# Patient Record
Sex: Female | Born: 1940 | Race: White | Hispanic: No | Smoking: Former smoker
Health system: Southern US, Community
[De-identification: ages and names within clinical notes are randomized; demographics above are authoritative.]

## PROBLEM LIST (undated history)

## (undated) DIAGNOSIS — E785 Hyperlipidemia, unspecified: Secondary | ICD-10-CM

## (undated) DIAGNOSIS — B019 Varicella without complication: Secondary | ICD-10-CM

## (undated) DIAGNOSIS — E1129 Type 2 diabetes mellitus with other diabetic kidney complication: Secondary | ICD-10-CM

## (undated) DIAGNOSIS — D721 Eosinophilia, unspecified: Secondary | ICD-10-CM

## (undated) DIAGNOSIS — Z87442 Personal history of urinary calculi: Secondary | ICD-10-CM

## (undated) DIAGNOSIS — E039 Hypothyroidism, unspecified: Secondary | ICD-10-CM

## (undated) DIAGNOSIS — M1990 Unspecified osteoarthritis, unspecified site: Secondary | ICD-10-CM

## (undated) DIAGNOSIS — I10 Essential (primary) hypertension: Secondary | ICD-10-CM

## (undated) DIAGNOSIS — M810 Age-related osteoporosis without current pathological fracture: Secondary | ICD-10-CM

## (undated) DIAGNOSIS — E119 Type 2 diabetes mellitus without complications: Secondary | ICD-10-CM

## (undated) DIAGNOSIS — K5792 Diverticulitis of intestine, part unspecified, without perforation or abscess without bleeding: Secondary | ICD-10-CM

## (undated) DIAGNOSIS — G4733 Obstructive sleep apnea (adult) (pediatric): Secondary | ICD-10-CM

## (undated) DIAGNOSIS — K219 Gastro-esophageal reflux disease without esophagitis: Secondary | ICD-10-CM

## (undated) HISTORY — PX: CATARACT EXTRACTION: SUR2

## (undated) HISTORY — DX: Age-related osteoporosis without current pathological fracture: M81.0

## (undated) HISTORY — DX: Hypothyroidism, unspecified: E03.9

## (undated) HISTORY — DX: Unspecified osteoarthritis, unspecified site: M19.90

## (undated) HISTORY — DX: Eosinophilia, unspecified: D72.10

## (undated) HISTORY — DX: Hyperlipidemia, unspecified: E78.5

## (undated) HISTORY — DX: Eosinophilia: D72.1

## (undated) HISTORY — PX: LASIK: SHX215

## (undated) HISTORY — DX: Essential (primary) hypertension: I10

## (undated) HISTORY — DX: Obstructive sleep apnea (adult) (pediatric): G47.33

## (undated) HISTORY — DX: Type 2 diabetes mellitus with other diabetic kidney complication: E11.29

## (undated) HISTORY — DX: Varicella without complication: B01.9

---

## 1970-11-25 HISTORY — PX: OTHER SURGICAL HISTORY: SHX169

## 1999-02-27 ENCOUNTER — Ambulatory Visit (HOSPITAL_COMMUNITY): Admission: RE | Admit: 1999-02-27 | Discharge: 1999-02-27 | Payer: Self-pay | Admitting: *Deleted

## 1999-03-09 ENCOUNTER — Ambulatory Visit (HOSPITAL_COMMUNITY): Admission: RE | Admit: 1999-03-09 | Discharge: 1999-03-09 | Payer: Self-pay | Admitting: *Deleted

## 1999-03-12 ENCOUNTER — Other Ambulatory Visit: Admission: RE | Admit: 1999-03-12 | Discharge: 1999-03-12 | Payer: Self-pay | Admitting: *Deleted

## 2000-03-17 ENCOUNTER — Other Ambulatory Visit: Admission: RE | Admit: 2000-03-17 | Discharge: 2000-03-17 | Payer: Self-pay | Admitting: Internal Medicine

## 2000-04-04 ENCOUNTER — Ambulatory Visit (HOSPITAL_COMMUNITY): Admission: RE | Admit: 2000-04-04 | Discharge: 2000-04-04 | Payer: Self-pay | Admitting: *Deleted

## 2001-03-10 ENCOUNTER — Other Ambulatory Visit: Admission: RE | Admit: 2001-03-10 | Discharge: 2001-03-10 | Payer: Self-pay | Admitting: *Deleted

## 2001-04-06 ENCOUNTER — Ambulatory Visit (HOSPITAL_COMMUNITY): Admission: RE | Admit: 2001-04-06 | Discharge: 2001-04-06 | Payer: Self-pay | Admitting: *Deleted

## 2002-07-12 ENCOUNTER — Emergency Department (HOSPITAL_COMMUNITY): Admission: EM | Admit: 2002-07-12 | Discharge: 2002-07-12 | Payer: Self-pay | Admitting: Emergency Medicine

## 2002-08-20 ENCOUNTER — Encounter: Payer: Self-pay | Admitting: Family Medicine

## 2002-08-20 ENCOUNTER — Ambulatory Visit (HOSPITAL_COMMUNITY): Admission: RE | Admit: 2002-08-20 | Discharge: 2002-08-20 | Payer: Self-pay | Admitting: Family Medicine

## 2003-12-27 ENCOUNTER — Ambulatory Visit (HOSPITAL_COMMUNITY): Admission: RE | Admit: 2003-12-27 | Discharge: 2003-12-27 | Payer: Self-pay | Admitting: Family Medicine

## 2006-02-03 ENCOUNTER — Ambulatory Visit (HOSPITAL_COMMUNITY): Admission: RE | Admit: 2006-02-03 | Discharge: 2006-02-03 | Payer: Self-pay | Admitting: Family Medicine

## 2006-07-01 ENCOUNTER — Ambulatory Visit: Payer: Self-pay | Admitting: Gastroenterology

## 2007-02-20 ENCOUNTER — Ambulatory Visit (HOSPITAL_COMMUNITY): Admission: RE | Admit: 2007-02-20 | Discharge: 2007-02-20 | Payer: Self-pay | Admitting: *Deleted

## 2008-06-01 ENCOUNTER — Ambulatory Visit (HOSPITAL_COMMUNITY): Admission: RE | Admit: 2008-06-01 | Discharge: 2008-06-01 | Payer: Self-pay | Admitting: *Deleted

## 2009-06-20 ENCOUNTER — Ambulatory Visit (HOSPITAL_COMMUNITY): Admission: RE | Admit: 2009-06-20 | Discharge: 2009-06-20 | Payer: Self-pay | Admitting: *Deleted

## 2010-08-14 ENCOUNTER — Ambulatory Visit (HOSPITAL_COMMUNITY): Admission: RE | Admit: 2010-08-14 | Discharge: 2010-08-14 | Payer: Self-pay | Admitting: Internal Medicine

## 2010-11-30 ENCOUNTER — Ambulatory Visit: Payer: Self-pay | Admitting: Oncology

## 2010-12-06 LAB — CBC WITH DIFFERENTIAL/PLATELET
BASO%: 0.7 % (ref 0.0–2.0)
Basophils Absolute: 0 10*3/uL (ref 0.0–0.1)
EOS%: 9.9 % — ABNORMAL HIGH (ref 0.0–7.0)
Eosinophils Absolute: 0.7 10*3/uL — ABNORMAL HIGH (ref 0.0–0.5)
HCT: 36.1 % (ref 34.8–46.6)
HGB: 12.1 g/dL (ref 11.6–15.9)
LYMPH%: 20.3 % (ref 14.0–49.7)
MCH: 30.4 pg (ref 25.1–34.0)
MCHC: 33.5 g/dL (ref 31.5–36.0)
MCV: 90.7 fL (ref 79.5–101.0)
MONO#: 0.7 10*3/uL (ref 0.1–0.9)
MONO%: 10.2 % (ref 0.0–14.0)
NEUT#: 4 10*3/uL (ref 1.5–6.5)
NEUT%: 58.9 % (ref 38.4–76.8)
Platelets: 281 10*3/uL (ref 145–400)
RBC: 3.98 10*6/uL (ref 3.70–5.45)
RDW: 13.1 % (ref 11.2–14.5)
WBC: 6.7 10*3/uL (ref 3.9–10.3)
lymph#: 1.4 10*3/uL (ref 0.9–3.3)

## 2010-12-06 LAB — COMPREHENSIVE METABOLIC PANEL
ALT: 10 U/L (ref 0–35)
AST: 14 U/L (ref 0–37)
Albumin: 4.2 g/dL (ref 3.5–5.2)
Alkaline Phosphatase: 82 U/L (ref 39–117)
BUN: 23 mg/dL (ref 6–23)
CO2: 27 mEq/L (ref 19–32)
Calcium: 10.1 mg/dL (ref 8.4–10.5)
Chloride: 103 mEq/L (ref 96–112)
Creatinine, Ser: 0.88 mg/dL (ref 0.40–1.20)
Glucose, Bld: 85 mg/dL (ref 70–99)
Potassium: 4.2 mEq/L (ref 3.5–5.3)
Sodium: 140 mEq/L (ref 135–145)
Total Bilirubin: 0.3 mg/dL (ref 0.3–1.2)
Total Protein: 6.9 g/dL (ref 6.0–8.3)

## 2010-12-06 LAB — CHCC SMEAR

## 2011-04-05 ENCOUNTER — Encounter (HOSPITAL_BASED_OUTPATIENT_CLINIC_OR_DEPARTMENT_OTHER): Payer: Medicare Other | Admitting: Oncology

## 2011-04-05 ENCOUNTER — Other Ambulatory Visit: Payer: Self-pay | Admitting: Oncology

## 2011-04-05 DIAGNOSIS — D721 Eosinophilia, unspecified: Secondary | ICD-10-CM

## 2011-04-05 LAB — CBC WITH DIFFERENTIAL/PLATELET
Eosinophils Absolute: 0.8 10*3/uL — ABNORMAL HIGH (ref 0.0–0.5)
HGB: 12.6 g/dL (ref 11.6–15.9)
LYMPH%: 20.9 % (ref 14.0–49.7)
MCH: 30.8 pg (ref 25.1–34.0)
MONO#: 0.7 10*3/uL (ref 0.1–0.9)
MONO%: 11.1 % (ref 0.0–14.0)
NEUT%: 54.3 % (ref 38.4–76.8)

## 2011-09-18 ENCOUNTER — Other Ambulatory Visit (HOSPITAL_COMMUNITY): Payer: Self-pay | Admitting: Internal Medicine

## 2011-09-18 DIAGNOSIS — Z1231 Encounter for screening mammogram for malignant neoplasm of breast: Secondary | ICD-10-CM

## 2011-10-10 ENCOUNTER — Ambulatory Visit (HOSPITAL_COMMUNITY)
Admission: RE | Admit: 2011-10-10 | Discharge: 2011-10-10 | Disposition: A | Discharge status: Home / Self Care | Payer: Medicare Other | Source: Ambulatory Visit | Attending: Internal Medicine | Admitting: Internal Medicine

## 2011-10-10 DIAGNOSIS — Z1231 Encounter for screening mammogram for malignant neoplasm of breast: Secondary | ICD-10-CM

## 2012-01-16 ENCOUNTER — Other Ambulatory Visit: Payer: Self-pay | Admitting: Nurse Practitioner

## 2012-01-16 ENCOUNTER — Other Ambulatory Visit (HOSPITAL_COMMUNITY)
Admission: RE | Admit: 2012-01-16 | Discharge: 2012-01-16 | Disposition: A | Discharge status: Home / Self Care | Payer: Medicare Other | Source: Ambulatory Visit | Attending: Obstetrics and Gynecology | Admitting: Obstetrics and Gynecology

## 2012-01-16 DIAGNOSIS — Z1159 Encounter for screening for other viral diseases: Secondary | ICD-10-CM | POA: Insufficient documentation

## 2012-01-16 DIAGNOSIS — Z124 Encounter for screening for malignant neoplasm of cervix: Secondary | ICD-10-CM | POA: Insufficient documentation

## 2012-01-16 DIAGNOSIS — N760 Acute vaginitis: Secondary | ICD-10-CM | POA: Insufficient documentation

## 2012-01-28 ENCOUNTER — Other Ambulatory Visit: Payer: Self-pay | Admitting: Nurse Practitioner

## 2013-08-19 ENCOUNTER — Other Ambulatory Visit (HOSPITAL_COMMUNITY): Payer: Self-pay | Admitting: Internal Medicine

## 2013-08-19 DIAGNOSIS — Z1231 Encounter for screening mammogram for malignant neoplasm of breast: Secondary | ICD-10-CM

## 2013-08-24 ENCOUNTER — Ambulatory Visit (HOSPITAL_COMMUNITY)
Admission: RE | Admit: 2013-08-24 | Discharge: 2013-08-24 | Disposition: A | Discharge status: Home / Self Care | Payer: Medicare Other | Source: Ambulatory Visit | Attending: Internal Medicine | Admitting: Internal Medicine

## 2013-08-24 DIAGNOSIS — Z1231 Encounter for screening mammogram for malignant neoplasm of breast: Secondary | ICD-10-CM | POA: Insufficient documentation

## 2013-09-20 ENCOUNTER — Encounter: Payer: Self-pay | Admitting: Interventional Cardiology

## 2013-09-21 ENCOUNTER — Encounter: Payer: Self-pay | Admitting: Interventional Cardiology

## 2013-09-21 ENCOUNTER — Ambulatory Visit (INDEPENDENT_AMBULATORY_CARE_PROVIDER_SITE_OTHER): Payer: Medicare Other | Admitting: Interventional Cardiology

## 2013-09-21 VITALS — BP 98/64 | HR 73 | Ht 62.0 in | Wt 153.0 lb

## 2013-09-21 DIAGNOSIS — Z0389 Encounter for observation for other suspected diseases and conditions ruled out: Secondary | ICD-10-CM

## 2013-09-21 DIAGNOSIS — I10 Essential (primary) hypertension: Secondary | ICD-10-CM

## 2013-09-21 DIAGNOSIS — E782 Mixed hyperlipidemia: Secondary | ICD-10-CM

## 2013-09-21 NOTE — Progress Notes (Signed)
Patient ID: Renee Stout, female   DOB: [birth year], 72 y.o.   MRN: [id number]    [address] [place], [place]  [address] Phone: [phone] Fax:  [phone]  Date:  09/21/2013   ID:  Renee Stout, DOB 1941-07-02, MRN [id number]  PCP:  Darnelle Bos, MD      History of Present Illness: Renee Stout is a 72 y.o. female  who has had intermittent chest pain in 8/13-10/13. THe pain is across her chest and radiates to the jaw. Episodes always occurred at night and last from 1-3 hours. Some relief with baking soda and water. Sx not related to exertion. She walks on a treadmill 5 days a week for 45 minutes. No problems with this. No sweating with the episodes. She had a negative Cardiolite in 2013. She exercised for 6 minutes and there were no symptoms. Perfusion images were normal. Left ventricular ejection fraction was 77%.    No further sx since that time.  She walks about 5 days a week, 20 minutes a day.  She was taken off of DM meds due to improved sugars.         Wt Readings from Last 3 Encounters:  09/21/13 153 lb (69.4 kg)     Past Medical History  Diagnosis Date  . Unspecified hypothyroidism   . Type II or unspecified type diabetes mellitus with renal manifestations, not stated as uncontrolled(250.40)   . Hyperlipidemia   . Eosinophilia   . Obstructive sleep apnea (adult) (pediatric)   . Essential hypertension, benign   . Osteoporosis, unspecified     Current Outpatient Prescriptions  Medication Sig Dispense Refill  . Calcium Carb-Cholecalciferol (CALCIUM 600 + D PO) Take by mouth 2 (two) times daily.      Marland Kitchen lisinopril (PRINIVIL,ZESTRIL) 10 MG tablet Take one tablet daily      . Multiple Vitamins-Minerals (MULTIVITAL) tablet Take 1 tablet by mouth daily.      . simvastatin (ZOCOR) 40 MG tablet Take one tablet daily      . SYNTHROID 100 MCG tablet Take one tablet daily       No current facility-administered medications for this visit.     Allergies:    Allergies  Allergen Reactions  . Sulfa Antibiotics   . Sulfur     Causes hives    Social History:  The patient  reports that she quit smoking about 24 years ago. Her smoking use included Cigarettes. She smoked 0.00 packs per day for 10 years. She does not have any smokeless tobacco history on file. She reports that she drinks alcohol.   Family History:  The patient's family history includes Coronary artery disease in her other; Diabetes in her brother and sister.   ROS:  Please see the history of present illness.  No nausea, vomiting.  No fevers, chills.  No focal weakness.  No dysuria.    All other systems reviewed and negative.   PHYSICAL EXAM: VS:  BP 98/64  Pulse 73  Ht 5\' 2"  (1.575 m)  Wt 153 lb (69.4 kg)  BMI 27.98 kg/m2  SpO2 98% Well nourished, well developed, in no acute distress HEENT: normal Neck: no JVD, no carotid bruits Cardiac:  normal S1, S2; RRR;  Lungs:  clear to auscultation bilaterally, no wheezing, rhonchi or rales Abd: soft, nontender, no hepatomegaly Ext: no edema Skin: warm and dry Neuro:   no focal abnormalities noted  EKG:  Normal     ASSESSMENT AND  PLAN:   Hyperlipidemia LDL goal < 100  Continue Simvastatin Tablet, 20 MG, 1/2 tablet, Orally, Once a day .  In 9/14: TG 155, HDL 55, LDL 86. 3. 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 This was a false positive. Nuclear study showed normal wall motion.  Today's ECG was normal.  No further CP.  F/u prn.  Recommended 150 minutes of exercise per week. She should try to 5 days a week 30 minutes a day to keep her blood sugars down.  Signed, Fredric Mare, MD, [facility] 09/21/2013 9:51 AM

## 2013-09-21 NOTE — Patient Instructions (Signed)
Your physician recommends that you continue on your current medications as directed. Please refer to the Current Medication list given to you today.   Your physician recommends that you schedule a follow-up appointment as needed  

## 2014-03-23 ENCOUNTER — Encounter: Payer: Self-pay | Admitting: Interventional Cardiology

## 2014-03-25 ENCOUNTER — Encounter: Payer: Self-pay | Admitting: Interventional Cardiology

## 2014-04-06 ENCOUNTER — Encounter: Payer: Self-pay | Admitting: Internal Medicine

## 2014-04-06 ENCOUNTER — Ambulatory Visit (INDEPENDENT_AMBULATORY_CARE_PROVIDER_SITE_OTHER): Payer: Medicare HMO | Admitting: Internal Medicine

## 2014-04-06 VITALS — BP 102/62 | HR 79 | Temp 98.1°F | Ht 63.0 in | Wt 150.0 lb

## 2014-04-06 DIAGNOSIS — E119 Type 2 diabetes mellitus without complications: Secondary | ICD-10-CM

## 2014-04-06 DIAGNOSIS — K219 Gastro-esophageal reflux disease without esophagitis: Secondary | ICD-10-CM | POA: Insufficient documentation

## 2014-04-06 DIAGNOSIS — E782 Mixed hyperlipidemia: Secondary | ICD-10-CM

## 2014-04-06 DIAGNOSIS — I10 Essential (primary) hypertension: Secondary | ICD-10-CM

## 2014-04-06 NOTE — Progress Notes (Signed)
Pre visit review using our clinic review tool, if applicable. No additional management support is needed unless otherwise documented below in the visit note. 

## 2014-04-06 NOTE — Progress Notes (Signed)
HPI  Pt presents to the clinic today to establish care. She is transferring care from [facility]. She has no concerns today.  Past Medical History  Diagnosis Date  . Unspecified hypothyroidism   . Type II or unspecified type diabetes mellitus with renal manifestations, not stated as uncontrolled   . Hyperlipidemia   . Eosinophilia   . Obstructive sleep apnea (adult) (pediatric)   . Essential hypertension, benign   . Osteoporosis, unspecified   . Arthritis   . Chicken pox     Current Outpatient Prescriptions  Medication Sig Dispense Refill  . aspirin 81 MG tablet Take 81 mg by mouth daily.      . Calcium Carb-Cholecalciferol (CALCIUM 600 + D PO) Take by mouth 2 (two) times daily.      Marland Kitchen esomeprazole (NEXIUM) 20 MG capsule Take 20 mg by mouth daily at 12 noon.      Marland Kitchen lisinopril (PRINIVIL,ZESTRIL) 10 MG tablet Take one tablet daily      . Multiple Vitamins-Minerals (MULTIVITAL) tablet Take 1 tablet by mouth daily.      . simvastatin (ZOCOR) 40 MG tablet Take one tablet daily      . SYNTHROID 100 MCG tablet Take one tablet daily       No current facility-administered medications for this visit.    Allergies  Allergen Reactions  . Sulfa Antibiotics   . Sulfur     Causes hives    Family History  Problem Relation Age of Onset  . Coronary artery disease Other     positive for early CAD  . Diabetes Brother   . Diabetes Sister     History   Social History  . Marital Status: Married    Spouse Name: N/A    Number of Children: N/A  . Years of Education: N/A   Occupational History  . Not on file.   Social History Main Topics  . Smoking status: Former Smoker -- 10 years    Types: Cigarettes    Quit date: 11/25/1988  . Smokeless tobacco: Not on file  . Alcohol Use: Yes     Comment: rare   . Drug Use: No  . Sexual Activity: Not on file   Other Topics Concern  . Not on file   Social History Narrative   No caffeine   No Recreational drug use    Diet: regular    Exercise : yes - 30 min a day on treadmill 5x week    Occupation : retired Audiological scientist status : married ,Husband also our patient   Children: girls 1          ROS:  Constitutional: Denies fever, malaise, fatigue, headache or abrupt weight changes.  Respiratory: Denies difficulty breathing, shortness of breath, cough or sputum production.   Cardiovascular: Denies chest pain, chest tightness, palpitations or swelling in the hands or feet.  Skin: Denies redness, rashes, lesions or ulcercations.  Neurological: Denies dizziness, difficulty with memory, difficulty with speech or problems with balance and coordination.   No other specific complaints in a complete review of systems (except as listed in HPI above).  PE:  BP 102/62  Pulse 79  Temp(Src) 98.1 F (36.7 C) (Oral)  Ht 5\' 3"  (1.6 m)  Wt 150 lb (68.04 kg)  BMI 26.58 kg/m2  SpO2 98% Wt Readings from Last 3 Encounters:  04/06/14 150 lb (68.04 kg)  09/21/13 153 lb (69.4 kg)    General: Appears her stated age, well developed, well  nourished in NAD. Cardiovascular: Normal rate and rhythm. S1,S2 noted.  No murmur, rubs or gallops noted. No JVD or BLE edema. No carotid bruits noted. Pulmonary/Chest: Normal effort and positive vesicular breath sounds. No respiratory distress. No wheezes, rales or ronchi noted.  Neurological: Alert and oriented. Cranial nerves II-XII intact. Coordination normal. +DTRs bilaterally.   BMET    Component Value Date/Time   NA 140 12/06/2010 1035   K 4.2 12/06/2010 1035   CL 103 12/06/2010 1035   CO2 27 12/06/2010 1035   GLUCOSE 85 12/06/2010 1035   BUN 23 12/06/2010 1035   CREATININE 0.88 12/06/2010 1035   CALCIUM 10.1 12/06/2010 1035    Lipid Panel  No results found for this basename: chol, trig, hdl, cholhdl, vldl, ldlcalc    CBC    Component Value Date/Time   WBC 6.1 04/05/2011 1310   RBC 4.10 04/05/2011 1310   HGB 12.6 04/05/2011 1310   HCT 37.4 04/05/2011 1310   PLT 267  04/05/2011 1310   MCV 91.2 04/05/2011 1310   MCH 30.8 04/05/2011 1310   MCH 30.4 12/06/2010 1035   MCHC 33.8 04/05/2011 1310   RDW 13.2 04/05/2011 1310   LYMPHSABS 1.3 04/05/2011 1310   MONOABS 0.7 04/05/2011 1310   EOSABS 0.8* 04/05/2011 1310   BASOSABS 0.0 04/05/2011 1310    Hgb A1C No results found for this basename: HGBA1C     Assessment and Plan:

## 2014-04-06 NOTE — Assessment & Plan Note (Signed)
Well controlled on current therapy Will obtain labs from recent PCP

## 2014-04-06 NOTE — Patient Instructions (Addendum)
Type 2 Diabetes Mellitus, Adult Type 2 diabetes mellitus, often simply referred to as type 2 diabetes, is a long-lasting (chronic) disease. In type 2 diabetes, the pancreas does not make enough insulin (a hormone), the cells are less responsive to the insulin that is made (insulin resistance), or both. Normally, insulin moves sugars from food into the tissue cells. The tissue cells use the sugars for energy. The lack of insulin or the lack of normal response to insulin causes excess sugars to build up in the blood instead of going into the tissue cells. As a result, high blood sugar (hyperglycemia) develops. The effect of high sugar (glucose) levels can cause many complications. Type 2 diabetes was also previously called adult-onset diabetes but it can occur at any age.  RISK FACTORS  A person is predisposed to developing type 2 diabetes if someone in the family has the disease and also has one or more of the following primary risk factors:  Overweight.  An inactive lifestyle.  A history of consistently eating high-calorie foods. Maintaining a normal weight and regular physical activity can reduce the chance of developing type 2 diabetes. SYMPTOMS  A person with type 2 diabetes may not show symptoms initially. The symptoms of type 2 diabetes appear slowly. The symptoms include:  Increased thirst (polydipsia).  Increased urination (polyuria).  Increased urination during the night (nocturia).  Weight loss. This weight loss may be rapid.  Frequent, recurring infections.  Tiredness (fatigue).  Weakness.  Vision changes, such as blurred vision.  Fruity smell to your breath.  Abdominal pain.  Nausea or vomiting.  Cuts or bruises which are slow to heal.  Tingling or numbness in the hands or feet. DIAGNOSIS Type 2 diabetes is frequently not diagnosed until complications of diabetes are present. Type 2 diabetes is diagnosed when symptoms or complications are present and when blood  glucose levels are increased. Your blood glucose level may be checked by one or more of the following blood tests:  A fasting blood glucose test. You will not be allowed to eat for at least 8 hours before a blood sample is taken.  A random blood glucose test. Your blood glucose is checked at any time of the day regardless of when you ate.  A hemoglobin A1c blood glucose test. A hemoglobin A1c test provides information about blood glucose control over the previous 3 months.  An oral glucose tolerance test (OGTT). Your blood glucose is measured after you have not eaten (fasted) for 2 hours and then after you drink a glucose-containing beverage. TREATMENT   You may need to take insulin or diabetes medicine daily to keep blood glucose levels in the desired range.  You will need to match insulin dosing with exercise and healthy food choices. The treatment goal is to maintain the before meal blood sugar (preprandial glucose) level at 70 130 mg/dL. HOME CARE INSTRUCTIONS   Have your hemoglobin A1c level checked twice a year.  Perform daily blood glucose monitoring as directed by your caregiver.  Monitor urine ketones when you are ill and as directed by your caregiver.  Take your diabetes medicine or insulin as directed by your caregiver to maintain your blood glucose levels in the desired range.  Never run out of diabetes medicine or insulin. It is needed every day.  Adjust insulin based on your intake of carbohydrates. Carbohydrates can raise blood glucose levels but need to be included in your diet. Carbohydrates provide vitamins, minerals, and fiber which are an essential part of   a healthy diet. Carbohydrates are found in fruits, vegetables, whole grains, dairy products, legumes, and foods containing added sugars.    Eat healthy foods. Alternate 3 meals with 3 snacks.  Lose weight if overweight.  Carry a medical alert card or wear your medical alert jewelry.  Carry a 15 gram  carbohydrate snack with you at all times to treat low blood glucose (hypoglycemia). Some examples of 15 gram carbohydrate snacks include:  Glucose tablets, 3 or 4   Glucose gel, 15 gram tube  Raisins, 2 tablespoons (24 grams)  Jelly beans, 6  Animal crackers, 8  Regular pop, 4 ounces (120 mL)  Gummy treats, 9  Recognize hypoglycemia. Hypoglycemia occurs with blood glucose levels of 70 mg/dL and below. The risk for hypoglycemia increases when fasting or skipping meals, during or after intense exercise, and during sleep. Hypoglycemia symptoms can include:  Tremors or shakes.  Decreased ability to concentrate.  Sweating.  Increased heart rate.  Headache.  Dry mouth.  Hunger.  Irritability.  Anxiety.  Restless sleep.  Altered speech or coordination.  Confusion.  Treat hypoglycemia promptly. If you are alert and able to safely swallow, follow the 15:15 rule:  Take 15 20 grams of rapid-acting glucose or carbohydrate. Rapid-acting options include glucose gel, glucose tablets, or 4 ounces (120 mL) of fruit juice, regular soda, or low fat milk.  Check your blood glucose level 15 minutes after taking the glucose.  Take 15 20 grams more of glucose if the repeat blood glucose level is still 70 mg/dL or below.  Eat a meal or snack within 1 hour once blood glucose levels return to normal.    Be alert to polyuria and polydipsia which are early signs of hyperglycemia. An early awareness of hyperglycemia allows for prompt treatment. Treat hyperglycemia as directed by your caregiver.  Engage in at least 150 minutes of moderate-intensity physical activity a week, spread over at least 3 days of the week or as directed by your caregiver. In addition, you should engage in resistance exercise at least 2 times a week or as directed by your caregiver.  Adjust your medicine and food intake as needed if you start a new exercise or sport.  Follow your sick day plan at any time you  are unable to eat or drink as usual.  Avoid tobacco use.  Limit alcohol intake to no more than 1 drink per day for nonpregnant women and 2 drinks per day for men. You should drink alcohol only when you are also eating food. Talk with your caregiver whether alcohol is safe for you. Tell your caregiver if you drink alcohol several times a week.  Follow up with your caregiver regularly.  Schedule an eye exam soon after the diagnosis of type 2 diabetes and then annually.  Perform daily skin and foot care. Examine your skin and feet daily for cuts, bruises, redness, nail problems, bleeding, blisters, or sores. A foot exam by a caregiver should be done annually.  Brush your teeth and gums at least twice a day and floss at least once a day. Follow up with your dentist regularly.  Share your diabetes management plan with your workplace or school.  Stay up-to-date with immunizations.  Learn to manage stress.  Obtain ongoing diabetes education and support as needed.  Participate in, or seek rehabilitation as needed to maintain or improve independence and quality of life. Request a physical or occupational therapy referral if you are having foot or hand numbness or difficulties with grooming,   dressing, eating, or physical activity. SEEK MEDICAL CARE IF:   You are unable to eat food or drink fluids for more than 6 hours.  You have nausea and vomiting for more than 6 hours.  Your blood glucose level is over 240 mg/dL.  There is a change in mental status.  You develop an additional serious illness.  You have diarrhea for more than 6 hours.  You have been sick or have had a fever for a couple of days and are not getting better.  You have pain during any physical activity.  SEEK IMMEDIATE MEDICAL CARE IF:  You have difficulty breathing.  You have moderate to large ketone levels. MAKE SURE YOU:  Understand these instructions.  Will watch your condition.  Will get help right away if  you are not doing well or get worse. Document Released: 11/11/2005 Document Revised: 08/05/2012 Document Reviewed: 06/09/2012 [employer] Patient Information 2014 [employer].  

## 2014-04-06 NOTE — Assessment & Plan Note (Signed)
Diet controlled Will get most recent labs from previous PCP

## 2014-04-06 NOTE — Assessment & Plan Note (Signed)
No issues on zocor

## 2014-04-06 NOTE — Assessment & Plan Note (Signed)
No issues on nexium 

## 2014-05-16 ENCOUNTER — Other Ambulatory Visit: Payer: Self-pay | Admitting: *Deleted

## 2014-05-16 MED ORDER — LEVOTHYROXINE SODIUM 100 MCG PO TABS: 100.0000 ug | ORAL_TABLET | Freq: Every day | ORAL | Status: DC

## 2014-06-16 ENCOUNTER — Other Ambulatory Visit (HOSPITAL_COMMUNITY): Payer: Self-pay | Admitting: Internal Medicine

## 2014-06-16 DIAGNOSIS — M8580 Other specified disorders of bone density and structure, unspecified site: Secondary | ICD-10-CM

## 2014-06-20 ENCOUNTER — Ambulatory Visit (HOSPITAL_COMMUNITY)
Admission: RE | Admit: 2014-06-20 | Discharge: 2014-06-20 | Disposition: A | Discharge status: Home / Self Care | Payer: Medicare HMO | Source: Ambulatory Visit | Attending: Internal Medicine | Admitting: Internal Medicine

## 2014-06-20 DIAGNOSIS — Z13820 Encounter for screening for osteoporosis: Secondary | ICD-10-CM | POA: Diagnosis not present

## 2014-06-20 DIAGNOSIS — M8580 Other specified disorders of bone density and structure, unspecified site: Secondary | ICD-10-CM

## 2014-06-20 DIAGNOSIS — Z780 Asymptomatic menopausal state: Secondary | ICD-10-CM | POA: Diagnosis not present

## 2014-08-24 ENCOUNTER — Other Ambulatory Visit (HOSPITAL_COMMUNITY): Payer: Self-pay | Admitting: Internal Medicine

## 2014-08-24 DIAGNOSIS — Z1231 Encounter for screening mammogram for malignant neoplasm of breast: Secondary | ICD-10-CM

## 2014-08-31 ENCOUNTER — Ambulatory Visit (HOSPITAL_COMMUNITY)
Admission: RE | Admit: 2014-08-31 | Discharge: 2014-08-31 | Disposition: A | Discharge status: Home / Self Care | Payer: Medicare HMO | Source: Ambulatory Visit | Attending: Internal Medicine | Admitting: Internal Medicine

## 2014-08-31 DIAGNOSIS — Z1231 Encounter for screening mammogram for malignant neoplasm of breast: Secondary | ICD-10-CM | POA: Diagnosis not present

## 2014-10-07 ENCOUNTER — Ambulatory Visit: Payer: Medicare HMO | Admitting: Internal Medicine

## 2014-11-06 ENCOUNTER — Other Ambulatory Visit: Payer: Self-pay | Admitting: Internal Medicine

## 2014-12-12 ENCOUNTER — Other Ambulatory Visit: Payer: Self-pay | Admitting: Internal Medicine

## 2015-02-09 ENCOUNTER — Other Ambulatory Visit: Payer: Self-pay | Admitting: Internal Medicine

## 2015-02-09 DIAGNOSIS — R3129 Other microscopic hematuria: Secondary | ICD-10-CM

## 2015-02-10 ENCOUNTER — Ambulatory Visit
Admission: RE | Admit: 2015-02-10 | Discharge: 2015-02-10 | Disposition: A | Discharge status: Home / Self Care | Payer: PPO | Source: Ambulatory Visit | Attending: Internal Medicine | Admitting: Internal Medicine

## 2015-02-10 DIAGNOSIS — R3129 Other microscopic hematuria: Secondary | ICD-10-CM

## 2015-03-08 ENCOUNTER — Other Ambulatory Visit: Payer: Self-pay | Admitting: Urology

## 2015-03-15 ENCOUNTER — Encounter (HOSPITAL_COMMUNITY): Payer: Self-pay | Admitting: *Deleted

## 2015-03-23 ENCOUNTER — Encounter (HOSPITAL_COMMUNITY)
Admission: RE | Disposition: A | Discharge status: Home / Self Care | Payer: Self-pay | Source: Ambulatory Visit | Attending: Urology

## 2015-03-23 ENCOUNTER — Ambulatory Visit (HOSPITAL_COMMUNITY): Payer: PPO

## 2015-03-23 ENCOUNTER — Ambulatory Visit (HOSPITAL_COMMUNITY)
Admission: RE | Admit: 2015-03-23 | Discharge: 2015-03-23 | Disposition: A | Discharge status: Home / Self Care | Payer: PPO | Source: Ambulatory Visit | Attending: Urology | Admitting: Urology

## 2015-03-23 ENCOUNTER — Encounter (HOSPITAL_COMMUNITY): Payer: Self-pay | Admitting: *Deleted

## 2015-03-23 DIAGNOSIS — M810 Age-related osteoporosis without current pathological fracture: Secondary | ICD-10-CM | POA: Diagnosis not present

## 2015-03-23 DIAGNOSIS — M1990 Unspecified osteoarthritis, unspecified site: Secondary | ICD-10-CM | POA: Diagnosis not present

## 2015-03-23 DIAGNOSIS — Z87891 Personal history of nicotine dependence: Secondary | ICD-10-CM | POA: Insufficient documentation

## 2015-03-23 DIAGNOSIS — Z79899 Other long term (current) drug therapy: Secondary | ICD-10-CM | POA: Diagnosis not present

## 2015-03-23 DIAGNOSIS — E0590 Thyrotoxicosis, unspecified without thyrotoxic crisis or storm: Secondary | ICD-10-CM | POA: Diagnosis not present

## 2015-03-23 DIAGNOSIS — Z882 Allergy status to sulfonamides status: Secondary | ICD-10-CM | POA: Diagnosis not present

## 2015-03-23 DIAGNOSIS — G4730 Sleep apnea, unspecified: Secondary | ICD-10-CM | POA: Diagnosis not present

## 2015-03-23 DIAGNOSIS — N201 Calculus of ureter: Secondary | ICD-10-CM | POA: Diagnosis present

## 2015-03-23 DIAGNOSIS — E119 Type 2 diabetes mellitus without complications: Secondary | ICD-10-CM | POA: Diagnosis not present

## 2015-03-23 DIAGNOSIS — E785 Hyperlipidemia, unspecified: Secondary | ICD-10-CM | POA: Diagnosis not present

## 2015-03-23 HISTORY — DX: Gastro-esophageal reflux disease without esophagitis: K21.9

## 2015-03-23 HISTORY — DX: Diverticulitis of intestine, part unspecified, without perforation or abscess without bleeding: K57.92

## 2015-03-23 SURGERY — LITHOTRIPSY, ESWL
Anesthesia: LOCAL | Laterality: Right

## 2015-03-23 MED ORDER — SODIUM CHLORIDE 0.9 % IV SOLN
INTRAVENOUS | Status: DC
  Administered 2015-03-23: 15:00:00 via INTRAVENOUS

## 2015-03-23 MED ORDER — DIAZEPAM 5 MG PO TABS
10.0000 mg | ORAL_TABLET | ORAL | Status: AC
  Administered 2015-03-23: 10 mg via ORAL
  Filled 2015-03-23: qty 2

## 2015-03-23 MED ORDER — CIPROFLOXACIN HCL 500 MG PO TABS: 500.0000 mg | ORAL_TABLET | ORAL | Status: DC

## 2015-03-23 MED ORDER — DIPHENHYDRAMINE HCL 25 MG PO CAPS
25.0000 mg | ORAL_CAPSULE | ORAL | Status: AC
  Administered 2015-03-23: 25 mg via ORAL
  Filled 2015-03-23: qty 1

## 2015-03-23 NOTE — Discharge Instructions (Signed)
See [facility] discharge instructions in chart.  

## 2015-03-23 NOTE — Op Note (Addendum)
See [facility] OP note scanned into chart. Also because of the size, density, location and other factors that cannot be anticipated I feel this will likely be a staged procedure. This fact supersedes any indication in the scanned [facility] operative note to the contrary.  

## 2015-03-23 NOTE — H&P (Signed)
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 She presented to her PCP with right flank pain and was found to have a 40mm stone in the proximal right ureter.   She presents today endorsing only mild right lower back pain. Initially her pain was in the right lower back, right flank and right lower quadrant of the abdomen. She can not confirm or deny that she has seen the passing of her stone identified on 3/18 via CT ordered by her PCP. She endorses frequency and mild urge incontinence but this has not changed with the onset of her current symptoms as they are her baseline urinary function. She denies fevers, gross hematuria or nausea/vomiting. She states taking one pain pill on friday night but has since required no additional therapy. She is not on tamsulosin or other alpha blockers. She states having a kidney stone before several decades ago and presumes that she passed it.  Interval: KUB at last follow-up demonstrated that the stone that moved, it was located in the proximal ureter at L4. The patient was continued on medical expulsion therapy. She presents today for follow-up 2 weeks. The patient states that she has not had any pain in over a week. She does have some intermittent dull ache in the right flank, does not feel she is passed her stone. She denies any urinary tract symptoms including dysuria or gross hematuria. She denies any fevers or chills.   Past Medical History Problems  1. History of Arthritis 2. History of Cholelithiasis, unspecified acuity, unspecified acuity 3. History of diabetes mellitus (Z86.39) 4. History of hyperlipidemia (Z86.39) 5. History of sleep apnea (Z87.09) 6. History of Hyperthyroidism (E05.90) 7. History of Osteoporosis (M81.0) 8. History of Thyroid Disorder  Surgical History Problems  1. History of Treatment Of Forearm Fracture  Current  Meds 1. Lisinopril 5 MG Oral Tablet; 1 per day;  Therapy: (Recorded:06Nov2012) to Recorded 2. Simvastatin TABS; 20mg ; 1 per day;  Therapy: (Recorded:06Nov2012) to Recorded 3. Synthroid 88 MCG Oral Tablet; TAKE 1 TABLET DAILY;  Therapy: (Recorded:06Nov2012) to Recorded 4. Tamsulosin HCl - 0.4 MG Oral Capsule; TAKE 1 CAPSULE Daily;  Therapy: 22Mar2016 to (Evaluate:21Apr2016)  Requested for: 22Mar2016; Last  Rx:22Mar2016 Ordered  Allergies Medication  1. Sulfa Drugs  Family History Problems  1. Family history of Alzheimer's Disease : Mother 2. Family history of Cholelithiasis : Brother 3. Family history of Congestive Heart Failure : Father 4. Family history of Family Health Status Number Of Children   1 daughter 5. Family history of Father Deceased At Age ____   43: Heart Failure 6. Family history of Mother Deceased At Age ____   49: Alzheimer's Disease  Social History Problems  1. Denied: History of Alcohol Use 2. Denied: History of Caffeine Use 3. Former smoker [phone])   Smoked 1/2 pack per day; Smoked for 30 years; Quit smoking 20years ago in 1992.     Denies any other forms of tobacco use. 4. Marital History - Currently Married 5. Occupation:   Retired: Psychologist, sport and exercise 6. Widowed  Vitals Vital Signs [Data Includes: Last 1 Day]  Recorded: 11Apr2016 11:10AM  Blood Pressure: 116 / 64 Temperature: 97.4 F Heart Rate: 99  Physical Exam Patient is in no acute distress  Heart sounds are normal without murmur, regular rate  Lungs are clear to auscultation bilaterally  Abdomen is soft, nontender nondistended, no CVA tenderness bilaterally,  Sherry's are symmetric without appreciable edema   Results/Data Urine [Data Includes: Last 1 Day]   11Apr2016  COLOR YELLOW   APPEARANCE CLEAR   SPECIFIC GRAVITY 1.020   pH 5.5   GLUCOSE NEG mg/dL  BILIRUBIN NEG   KETONE NEG mg/dL  BLOOD TRACE   PROTEIN NEG mg/dL  UROBILINOGEN 0.2 mg/dL  NITRITE NEG    LEUKOCYTE ESTERASE TRACE   SQUAMOUS EPITHELIAL/HPF FEW   WBC 3-6 WBC/hpf  RBC 3-6 RBC/hpf  BACTERIA NONE SEEN   CRYSTALS NONE SEEN   CASTS NONE SEEN    UA demonstrates microscopic hematuria, urine culture will be sent  KUB obtained in clinic today to evaluate her stone location. The renal shadows are apparent bilaterally. Film is slightly underpenetrated. A comparison was made to a KUB 2 weeks prior. There is a persistent calcification in the mid right ureter at the level of L4 seen on her previous KUB. Previously, I thought she may have 2 stones, this appears to just be one stone today which is more consistent with her CT scan almost a month ago. The phleboliths in her pelvis are unchanged. There are no additional stones appreciated along the trajectory of the right or left ureter. The patient does have some osteophytes along her vertebra.  Spine is grossly normal.   Assessment Assessed  1. Right ureteral calculus (N20.1)  Plan Health Maintenance  1. UA With REFLEX; [Do Not Release]; Status:Complete;   Done: [id number] 11:05AM Right ureteral calculus  2. Follow-up Schedule Surgery Office  Follow-up  Status: Hold For - Appointment   Requested for: 11Apr2016  Discussion/Summary I suspect the patient has a persistent right ureteral stone.We discussed management options including medical expulsion therapy, shockwave lithotripsy, and ureteroscopy. Ultimately, the patient has opted for shock wave lithotripsy. I discussed with the patient the procedure in detail as well as the risk and benefits. The patient is aware that she may need additional procedures. She also is aware of the risks of hematoma and pain. We will try to get this patient's scheduled as soon as possible.

## 2015-05-16 ENCOUNTER — Telehealth: Payer: Self-pay

## 2015-05-16 NOTE — Telephone Encounter (Signed)
Diabetic bundle. I contacted the pt and she stated Webb Silversmith NP is no longer her PCP.

## 2015-08-22 ENCOUNTER — Other Ambulatory Visit: Payer: Self-pay

## 2015-08-22 DIAGNOSIS — Z1231 Encounter for screening mammogram for malignant neoplasm of breast: Secondary | ICD-10-CM

## 2015-09-06 ENCOUNTER — Ambulatory Visit
Admission: RE | Admit: 2015-09-06 | Discharge: 2015-09-06 | Disposition: A | Discharge status: Home / Self Care | Payer: PPO | Source: Ambulatory Visit

## 2015-09-06 DIAGNOSIS — Z1231 Encounter for screening mammogram for malignant neoplasm of breast: Secondary | ICD-10-CM

## 2015-12-28 DIAGNOSIS — E039 Hypothyroidism, unspecified: Secondary | ICD-10-CM | POA: Diagnosis not present

## 2015-12-28 DIAGNOSIS — E1121 Type 2 diabetes mellitus with diabetic nephropathy: Secondary | ICD-10-CM | POA: Diagnosis not present

## 2016-01-19 DIAGNOSIS — E119 Type 2 diabetes mellitus without complications: Secondary | ICD-10-CM | POA: Diagnosis not present

## 2016-01-19 DIAGNOSIS — Z1389 Encounter for screening for other disorder: Secondary | ICD-10-CM | POA: Diagnosis not present

## 2016-01-19 DIAGNOSIS — E785 Hyperlipidemia, unspecified: Secondary | ICD-10-CM | POA: Diagnosis not present

## 2016-01-19 DIAGNOSIS — M8588 Other specified disorders of bone density and structure, other site: Secondary | ICD-10-CM | POA: Diagnosis not present

## 2016-01-19 DIAGNOSIS — E039 Hypothyroidism, unspecified: Secondary | ICD-10-CM | POA: Diagnosis not present

## 2016-01-19 DIAGNOSIS — Z0000 Encounter for general adult medical examination without abnormal findings: Secondary | ICD-10-CM | POA: Diagnosis not present

## 2016-04-26 ENCOUNTER — Other Ambulatory Visit: Payer: Self-pay | Admitting: Internal Medicine

## 2016-04-26 DIAGNOSIS — R200 Anesthesia of skin: Secondary | ICD-10-CM | POA: Diagnosis not present

## 2016-05-01 ENCOUNTER — Ambulatory Visit
Admission: RE | Admit: 2016-05-01 | Discharge: 2016-05-01 | Disposition: A | Discharge status: Home / Self Care | Payer: PPO | Source: Ambulatory Visit | Attending: Internal Medicine | Admitting: Internal Medicine

## 2016-05-01 DIAGNOSIS — R200 Anesthesia of skin: Secondary | ICD-10-CM

## 2016-05-01 MED ORDER — IOPAMIDOL (ISOVUE-300) INJECTION 61%
75.0000 mL | Freq: Once | INTRAVENOUS | Status: AC | PRN
  Administered 2016-05-01: 75 mL via INTRAVENOUS

## 2016-06-24 DIAGNOSIS — M8588 Other specified disorders of bone density and structure, other site: Secondary | ICD-10-CM | POA: Diagnosis not present

## 2016-07-12 DIAGNOSIS — R1031 Right lower quadrant pain: Secondary | ICD-10-CM | POA: Diagnosis not present

## 2016-07-18 DIAGNOSIS — E119 Type 2 diabetes mellitus without complications: Secondary | ICD-10-CM | POA: Diagnosis not present

## 2016-07-23 DIAGNOSIS — E039 Hypothyroidism, unspecified: Secondary | ICD-10-CM | POA: Diagnosis not present

## 2016-07-23 DIAGNOSIS — M85852 Other specified disorders of bone density and structure, left thigh: Secondary | ICD-10-CM | POA: Diagnosis not present

## 2016-07-23 DIAGNOSIS — E1121 Type 2 diabetes mellitus with diabetic nephropathy: Secondary | ICD-10-CM | POA: Diagnosis not present

## 2016-08-05 ENCOUNTER — Other Ambulatory Visit: Payer: Self-pay | Admitting: Internal Medicine

## 2016-08-05 DIAGNOSIS — Z1231 Encounter for screening mammogram for malignant neoplasm of breast: Secondary | ICD-10-CM

## 2016-08-12 DIAGNOSIS — L298 Other pruritus: Secondary | ICD-10-CM | POA: Diagnosis not present

## 2016-08-12 DIAGNOSIS — E039 Hypothyroidism, unspecified: Secondary | ICD-10-CM | POA: Diagnosis not present

## 2016-08-12 DIAGNOSIS — Z113 Encounter for screening for infections with a predominantly sexual mode of transmission: Secondary | ICD-10-CM | POA: Diagnosis not present

## 2016-08-22 DIAGNOSIS — R159 Full incontinence of feces: Secondary | ICD-10-CM | POA: Diagnosis not present

## 2016-08-22 DIAGNOSIS — N9089 Other specified noninflammatory disorders of vulva and perineum: Secondary | ICD-10-CM | POA: Diagnosis not present

## 2016-08-22 DIAGNOSIS — N898 Other specified noninflammatory disorders of vagina: Secondary | ICD-10-CM | POA: Diagnosis not present

## 2016-09-05 DIAGNOSIS — N9089 Other specified noninflammatory disorders of vulva and perineum: Secondary | ICD-10-CM | POA: Diagnosis not present

## 2016-09-05 DIAGNOSIS — R159 Full incontinence of feces: Secondary | ICD-10-CM | POA: Diagnosis not present

## 2016-09-06 ENCOUNTER — Ambulatory Visit
Admission: RE | Admit: 2016-09-06 | Discharge: 2016-09-06 | Disposition: A | Discharge status: Home / Self Care | Payer: PPO | Source: Ambulatory Visit | Attending: Internal Medicine | Admitting: Internal Medicine

## 2016-09-06 DIAGNOSIS — Z1231 Encounter for screening mammogram for malignant neoplasm of breast: Secondary | ICD-10-CM

## 2016-10-03 DIAGNOSIS — N762 Acute vulvitis: Secondary | ICD-10-CM | POA: Diagnosis not present

## 2016-10-30 DIAGNOSIS — H2513 Age-related nuclear cataract, bilateral: Secondary | ICD-10-CM | POA: Diagnosis not present

## 2016-10-30 DIAGNOSIS — H04123 Dry eye syndrome of bilateral lacrimal glands: Secondary | ICD-10-CM | POA: Diagnosis not present

## 2016-10-30 DIAGNOSIS — E119 Type 2 diabetes mellitus without complications: Secondary | ICD-10-CM | POA: Diagnosis not present

## 2017-01-14 DIAGNOSIS — C44329 Squamous cell carcinoma of skin of other parts of face: Secondary | ICD-10-CM | POA: Diagnosis not present

## 2017-01-14 DIAGNOSIS — D485 Neoplasm of uncertain behavior of skin: Secondary | ICD-10-CM | POA: Diagnosis not present

## 2017-01-20 DIAGNOSIS — Z0000 Encounter for general adult medical examination without abnormal findings: Secondary | ICD-10-CM | POA: Diagnosis not present

## 2017-01-20 DIAGNOSIS — Z1239 Encounter for other screening for malignant neoplasm of breast: Secondary | ICD-10-CM | POA: Diagnosis not present

## 2017-01-20 DIAGNOSIS — Z1389 Encounter for screening for other disorder: Secondary | ICD-10-CM | POA: Diagnosis not present

## 2017-01-20 DIAGNOSIS — E039 Hypothyroidism, unspecified: Secondary | ICD-10-CM | POA: Diagnosis not present

## 2017-01-20 DIAGNOSIS — E1121 Type 2 diabetes mellitus with diabetic nephropathy: Secondary | ICD-10-CM | POA: Diagnosis not present

## 2017-01-20 DIAGNOSIS — G4733 Obstructive sleep apnea (adult) (pediatric): Secondary | ICD-10-CM | POA: Diagnosis not present

## 2017-01-21 DIAGNOSIS — Z7984 Long term (current) use of oral hypoglycemic drugs: Secondary | ICD-10-CM | POA: Diagnosis not present

## 2017-01-21 DIAGNOSIS — M85852 Other specified disorders of bone density and structure, left thigh: Secondary | ICD-10-CM | POA: Diagnosis not present

## 2017-01-21 DIAGNOSIS — E038 Other specified hypothyroidism: Secondary | ICD-10-CM | POA: Diagnosis not present

## 2017-01-21 DIAGNOSIS — E1121 Type 2 diabetes mellitus with diabetic nephropathy: Secondary | ICD-10-CM | POA: Diagnosis not present

## 2017-02-20 DIAGNOSIS — C44329 Squamous cell carcinoma of skin of other parts of face: Secondary | ICD-10-CM | POA: Diagnosis not present

## 2017-04-09 DIAGNOSIS — H00022 Hordeolum internum right lower eyelid: Secondary | ICD-10-CM | POA: Diagnosis not present

## 2017-05-07 DIAGNOSIS — G4733 Obstructive sleep apnea (adult) (pediatric): Secondary | ICD-10-CM | POA: Diagnosis not present

## 2017-05-20 DIAGNOSIS — G4733 Obstructive sleep apnea (adult) (pediatric): Secondary | ICD-10-CM | POA: Diagnosis not present

## 2017-06-10 DIAGNOSIS — G4733 Obstructive sleep apnea (adult) (pediatric): Secondary | ICD-10-CM | POA: Diagnosis not present

## 2017-07-11 DIAGNOSIS — G4733 Obstructive sleep apnea (adult) (pediatric): Secondary | ICD-10-CM | POA: Diagnosis not present

## 2017-07-17 DIAGNOSIS — G4733 Obstructive sleep apnea (adult) (pediatric): Secondary | ICD-10-CM | POA: Diagnosis not present

## 2017-07-23 DIAGNOSIS — E039 Hypothyroidism, unspecified: Secondary | ICD-10-CM | POA: Diagnosis not present

## 2017-07-23 DIAGNOSIS — E1165 Type 2 diabetes mellitus with hyperglycemia: Secondary | ICD-10-CM | POA: Diagnosis not present

## 2017-07-23 DIAGNOSIS — M85852 Other specified disorders of bone density and structure, left thigh: Secondary | ICD-10-CM | POA: Diagnosis not present

## 2017-07-23 DIAGNOSIS — E1121 Type 2 diabetes mellitus with diabetic nephropathy: Secondary | ICD-10-CM | POA: Diagnosis not present

## 2017-07-23 DIAGNOSIS — G4733 Obstructive sleep apnea (adult) (pediatric): Secondary | ICD-10-CM | POA: Diagnosis not present

## 2017-08-05 DIAGNOSIS — G4733 Obstructive sleep apnea (adult) (pediatric): Secondary | ICD-10-CM | POA: Diagnosis not present

## 2017-08-11 DIAGNOSIS — G4733 Obstructive sleep apnea (adult) (pediatric): Secondary | ICD-10-CM | POA: Diagnosis not present

## 2017-09-10 DIAGNOSIS — G4733 Obstructive sleep apnea (adult) (pediatric): Secondary | ICD-10-CM | POA: Diagnosis not present

## 2017-09-24 ENCOUNTER — Other Ambulatory Visit: Payer: Self-pay | Admitting: Internal Medicine

## 2017-09-24 DIAGNOSIS — Z1231 Encounter for screening mammogram for malignant neoplasm of breast: Secondary | ICD-10-CM

## 2017-10-11 DIAGNOSIS — G4733 Obstructive sleep apnea (adult) (pediatric): Secondary | ICD-10-CM | POA: Diagnosis not present

## 2017-10-20 DIAGNOSIS — G4733 Obstructive sleep apnea (adult) (pediatric): Secondary | ICD-10-CM | POA: Diagnosis not present

## 2017-10-21 ENCOUNTER — Ambulatory Visit
Admission: RE | Admit: 2017-10-21 | Discharge: 2017-10-21 | Disposition: A | Discharge status: Home / Self Care | Payer: PPO | Source: Ambulatory Visit | Attending: Internal Medicine | Admitting: Internal Medicine

## 2017-10-21 DIAGNOSIS — Z1231 Encounter for screening mammogram for malignant neoplasm of breast: Secondary | ICD-10-CM | POA: Diagnosis not present

## 2017-11-10 DIAGNOSIS — G4733 Obstructive sleep apnea (adult) (pediatric): Secondary | ICD-10-CM | POA: Diagnosis not present

## 2017-11-26 DIAGNOSIS — R69 Illness, unspecified: Secondary | ICD-10-CM | POA: Diagnosis not present

## 2017-12-11 DIAGNOSIS — G4733 Obstructive sleep apnea (adult) (pediatric): Secondary | ICD-10-CM | POA: Diagnosis not present

## 2018-01-11 DIAGNOSIS — G4733 Obstructive sleep apnea (adult) (pediatric): Secondary | ICD-10-CM | POA: Diagnosis not present

## 2018-01-22 DIAGNOSIS — G4733 Obstructive sleep apnea (adult) (pediatric): Secondary | ICD-10-CM | POA: Diagnosis not present

## 2018-01-26 DIAGNOSIS — G4733 Obstructive sleep apnea (adult) (pediatric): Secondary | ICD-10-CM | POA: Diagnosis not present

## 2018-01-26 DIAGNOSIS — M85852 Other specified disorders of bone density and structure, left thigh: Secondary | ICD-10-CM | POA: Diagnosis not present

## 2018-01-26 DIAGNOSIS — E1121 Type 2 diabetes mellitus with diabetic nephropathy: Secondary | ICD-10-CM | POA: Diagnosis not present

## 2018-01-26 DIAGNOSIS — E039 Hypothyroidism, unspecified: Secondary | ICD-10-CM | POA: Diagnosis not present

## 2018-01-26 DIAGNOSIS — Z0000 Encounter for general adult medical examination without abnormal findings: Secondary | ICD-10-CM | POA: Diagnosis not present

## 2018-01-26 DIAGNOSIS — E785 Hyperlipidemia, unspecified: Secondary | ICD-10-CM | POA: Diagnosis not present

## 2018-02-08 DIAGNOSIS — G4733 Obstructive sleep apnea (adult) (pediatric): Secondary | ICD-10-CM | POA: Diagnosis not present

## 2018-02-17 DIAGNOSIS — Z85828 Personal history of other malignant neoplasm of skin: Secondary | ICD-10-CM | POA: Diagnosis not present

## 2018-02-17 DIAGNOSIS — L720 Epidermal cyst: Secondary | ICD-10-CM | POA: Diagnosis not present

## 2018-02-17 DIAGNOSIS — L821 Other seborrheic keratosis: Secondary | ICD-10-CM | POA: Diagnosis not present

## 2018-02-17 DIAGNOSIS — D485 Neoplasm of uncertain behavior of skin: Secondary | ICD-10-CM | POA: Diagnosis not present

## 2018-02-17 DIAGNOSIS — D224 Melanocytic nevi of scalp and neck: Secondary | ICD-10-CM | POA: Diagnosis not present

## 2018-02-17 DIAGNOSIS — D1801 Hemangioma of skin and subcutaneous tissue: Secondary | ICD-10-CM | POA: Diagnosis not present

## 2018-02-27 DIAGNOSIS — H0288A Meibomian gland dysfunction right eye, upper and lower eyelids: Secondary | ICD-10-CM | POA: Diagnosis not present

## 2018-02-27 DIAGNOSIS — H2513 Age-related nuclear cataract, bilateral: Secondary | ICD-10-CM | POA: Diagnosis not present

## 2018-02-27 DIAGNOSIS — E119 Type 2 diabetes mellitus without complications: Secondary | ICD-10-CM | POA: Diagnosis not present

## 2018-02-27 DIAGNOSIS — H524 Presbyopia: Secondary | ICD-10-CM | POA: Diagnosis not present

## 2018-02-27 DIAGNOSIS — H0288B Meibomian gland dysfunction left eye, upper and lower eyelids: Secondary | ICD-10-CM | POA: Diagnosis not present

## 2018-03-11 DIAGNOSIS — G4733 Obstructive sleep apnea (adult) (pediatric): Secondary | ICD-10-CM | POA: Diagnosis not present

## 2018-04-10 DIAGNOSIS — G4733 Obstructive sleep apnea (adult) (pediatric): Secondary | ICD-10-CM | POA: Diagnosis not present

## 2018-05-11 DIAGNOSIS — G4733 Obstructive sleep apnea (adult) (pediatric): Secondary | ICD-10-CM | POA: Diagnosis not present

## 2018-05-18 DIAGNOSIS — Z0100 Encounter for examination of eyes and vision without abnormal findings: Secondary | ICD-10-CM | POA: Diagnosis not present

## 2018-05-25 DIAGNOSIS — Z0100 Encounter for examination of eyes and vision without abnormal findings: Secondary | ICD-10-CM | POA: Diagnosis not present

## 2018-05-27 DIAGNOSIS — R69 Illness, unspecified: Secondary | ICD-10-CM | POA: Diagnosis not present

## 2018-06-10 DIAGNOSIS — G4733 Obstructive sleep apnea (adult) (pediatric): Secondary | ICD-10-CM | POA: Diagnosis not present

## 2018-06-15 DIAGNOSIS — E785 Hyperlipidemia, unspecified: Secondary | ICD-10-CM | POA: Diagnosis not present

## 2018-06-15 DIAGNOSIS — Z87891 Personal history of nicotine dependence: Secondary | ICD-10-CM | POA: Diagnosis not present

## 2018-06-15 DIAGNOSIS — Z7982 Long term (current) use of aspirin: Secondary | ICD-10-CM | POA: Diagnosis not present

## 2018-06-15 DIAGNOSIS — Z833 Family history of diabetes mellitus: Secondary | ICD-10-CM | POA: Diagnosis not present

## 2018-06-15 DIAGNOSIS — I10 Essential (primary) hypertension: Secondary | ICD-10-CM | POA: Diagnosis not present

## 2018-06-15 DIAGNOSIS — E039 Hypothyroidism, unspecified: Secondary | ICD-10-CM | POA: Diagnosis not present

## 2018-06-15 DIAGNOSIS — E119 Type 2 diabetes mellitus without complications: Secondary | ICD-10-CM | POA: Diagnosis not present

## 2018-06-15 DIAGNOSIS — R69 Illness, unspecified: Secondary | ICD-10-CM | POA: Diagnosis not present

## 2018-06-15 DIAGNOSIS — Z85828 Personal history of other malignant neoplasm of skin: Secondary | ICD-10-CM | POA: Diagnosis not present

## 2018-06-15 DIAGNOSIS — G4733 Obstructive sleep apnea (adult) (pediatric): Secondary | ICD-10-CM | POA: Diagnosis not present

## 2018-06-23 DIAGNOSIS — G4733 Obstructive sleep apnea (adult) (pediatric): Secondary | ICD-10-CM | POA: Diagnosis not present

## 2018-08-03 DIAGNOSIS — Z7984 Long term (current) use of oral hypoglycemic drugs: Secondary | ICD-10-CM | POA: Diagnosis not present

## 2018-08-03 DIAGNOSIS — H8113 Benign paroxysmal vertigo, bilateral: Secondary | ICD-10-CM | POA: Diagnosis not present

## 2018-08-03 DIAGNOSIS — E785 Hyperlipidemia, unspecified: Secondary | ICD-10-CM | POA: Diagnosis not present

## 2018-08-03 DIAGNOSIS — E039 Hypothyroidism, unspecified: Secondary | ICD-10-CM | POA: Diagnosis not present

## 2018-08-03 DIAGNOSIS — E1121 Type 2 diabetes mellitus with diabetic nephropathy: Secondary | ICD-10-CM | POA: Diagnosis not present

## 2018-08-17 DIAGNOSIS — E1121 Type 2 diabetes mellitus with diabetic nephropathy: Secondary | ICD-10-CM | POA: Diagnosis not present

## 2018-08-17 DIAGNOSIS — Z780 Asymptomatic menopausal state: Secondary | ICD-10-CM | POA: Diagnosis not present

## 2018-08-17 DIAGNOSIS — R69 Illness, unspecified: Secondary | ICD-10-CM | POA: Diagnosis not present

## 2018-08-31 DIAGNOSIS — G4733 Obstructive sleep apnea (adult) (pediatric): Secondary | ICD-10-CM | POA: Diagnosis not present

## 2018-09-01 ENCOUNTER — Other Ambulatory Visit: Payer: Self-pay | Admitting: Internal Medicine

## 2018-09-01 DIAGNOSIS — E2839 Other primary ovarian failure: Secondary | ICD-10-CM

## 2018-09-01 DIAGNOSIS — M8580 Other specified disorders of bone density and structure, unspecified site: Secondary | ICD-10-CM

## 2018-09-17 ENCOUNTER — Ambulatory Visit
Admission: RE | Admit: 2018-09-17 | Discharge: 2018-09-17 | Disposition: A | Discharge status: Home / Self Care | Payer: PPO | Source: Ambulatory Visit | Attending: Internal Medicine | Admitting: Internal Medicine

## 2018-09-17 ENCOUNTER — Other Ambulatory Visit: Payer: Self-pay | Admitting: Internal Medicine

## 2018-09-17 DIAGNOSIS — M5412 Radiculopathy, cervical region: Secondary | ICD-10-CM

## 2018-09-17 DIAGNOSIS — M50322 Other cervical disc degeneration at C5-C6 level: Secondary | ICD-10-CM | POA: Diagnosis not present

## 2018-09-17 DIAGNOSIS — M4312 Spondylolisthesis, cervical region: Secondary | ICD-10-CM | POA: Diagnosis not present

## 2018-10-27 ENCOUNTER — Other Ambulatory Visit: Payer: Self-pay | Admitting: Internal Medicine

## 2018-10-27 DIAGNOSIS — Z1231 Encounter for screening mammogram for malignant neoplasm of breast: Secondary | ICD-10-CM

## 2018-11-04 ENCOUNTER — Ambulatory Visit
Admission: RE | Admit: 2018-11-04 | Discharge: 2018-11-04 | Disposition: A | Discharge status: Home / Self Care | Payer: Medicare HMO | Source: Ambulatory Visit | Attending: Internal Medicine | Admitting: Internal Medicine

## 2018-11-04 DIAGNOSIS — E2839 Other primary ovarian failure: Secondary | ICD-10-CM

## 2018-11-04 DIAGNOSIS — M85851 Other specified disorders of bone density and structure, right thigh: Secondary | ICD-10-CM | POA: Diagnosis not present

## 2018-11-04 DIAGNOSIS — Z780 Asymptomatic menopausal state: Secondary | ICD-10-CM | POA: Diagnosis not present

## 2018-11-04 DIAGNOSIS — M8580 Other specified disorders of bone density and structure, unspecified site: Secondary | ICD-10-CM

## 2018-12-02 DIAGNOSIS — R69 Illness, unspecified: Secondary | ICD-10-CM | POA: Diagnosis not present

## 2018-12-03 ENCOUNTER — Ambulatory Visit
Admission: RE | Admit: 2018-12-03 | Discharge: 2018-12-03 | Disposition: A | Discharge status: Home / Self Care | Payer: Medicare HMO | Source: Ambulatory Visit | Attending: Internal Medicine | Admitting: Internal Medicine

## 2018-12-03 DIAGNOSIS — R69 Illness, unspecified: Secondary | ICD-10-CM | POA: Diagnosis not present

## 2018-12-03 DIAGNOSIS — Z1231 Encounter for screening mammogram for malignant neoplasm of breast: Secondary | ICD-10-CM | POA: Diagnosis not present

## 2018-12-16 DIAGNOSIS — R69 Illness, unspecified: Secondary | ICD-10-CM | POA: Diagnosis not present

## 2019-01-27 DIAGNOSIS — G4733 Obstructive sleep apnea (adult) (pediatric): Secondary | ICD-10-CM | POA: Diagnosis not present

## 2019-01-29 DIAGNOSIS — Z0000 Encounter for general adult medical examination without abnormal findings: Secondary | ICD-10-CM | POA: Diagnosis not present

## 2019-01-29 DIAGNOSIS — E039 Hypothyroidism, unspecified: Secondary | ICD-10-CM | POA: Diagnosis not present

## 2019-01-29 DIAGNOSIS — E1121 Type 2 diabetes mellitus with diabetic nephropathy: Secondary | ICD-10-CM | POA: Diagnosis not present

## 2019-01-29 DIAGNOSIS — E785 Hyperlipidemia, unspecified: Secondary | ICD-10-CM | POA: Diagnosis not present

## 2019-01-29 DIAGNOSIS — M85852 Other specified disorders of bone density and structure, left thigh: Secondary | ICD-10-CM | POA: Diagnosis not present

## 2019-01-29 DIAGNOSIS — G4733 Obstructive sleep apnea (adult) (pediatric): Secondary | ICD-10-CM | POA: Diagnosis not present

## 2019-01-29 DIAGNOSIS — Z1389 Encounter for screening for other disorder: Secondary | ICD-10-CM | POA: Diagnosis not present

## 2019-03-30 DIAGNOSIS — R69 Illness, unspecified: Secondary | ICD-10-CM | POA: Diagnosis not present

## 2019-06-23 DIAGNOSIS — R69 Illness, unspecified: Secondary | ICD-10-CM | POA: Diagnosis not present

## 2019-06-28 DIAGNOSIS — Z7722 Contact with and (suspected) exposure to environmental tobacco smoke (acute) (chronic): Secondary | ICD-10-CM | POA: Diagnosis not present

## 2019-06-28 DIAGNOSIS — Z7984 Long term (current) use of oral hypoglycemic drugs: Secondary | ICD-10-CM | POA: Diagnosis not present

## 2019-06-28 DIAGNOSIS — Z7982 Long term (current) use of aspirin: Secondary | ICD-10-CM | POA: Diagnosis not present

## 2019-06-28 DIAGNOSIS — E119 Type 2 diabetes mellitus without complications: Secondary | ICD-10-CM | POA: Diagnosis not present

## 2019-06-28 DIAGNOSIS — G4730 Sleep apnea, unspecified: Secondary | ICD-10-CM | POA: Diagnosis not present

## 2019-06-28 DIAGNOSIS — E785 Hyperlipidemia, unspecified: Secondary | ICD-10-CM | POA: Diagnosis not present

## 2019-06-28 DIAGNOSIS — Z833 Family history of diabetes mellitus: Secondary | ICD-10-CM | POA: Diagnosis not present

## 2019-06-28 DIAGNOSIS — R69 Illness, unspecified: Secondary | ICD-10-CM | POA: Diagnosis not present

## 2019-06-28 DIAGNOSIS — Z85828 Personal history of other malignant neoplasm of skin: Secondary | ICD-10-CM | POA: Diagnosis not present

## 2019-06-28 DIAGNOSIS — E039 Hypothyroidism, unspecified: Secondary | ICD-10-CM | POA: Diagnosis not present

## 2019-07-30 DIAGNOSIS — R69 Illness, unspecified: Secondary | ICD-10-CM | POA: Diagnosis not present

## 2019-08-05 DIAGNOSIS — E1121 Type 2 diabetes mellitus with diabetic nephropathy: Secondary | ICD-10-CM | POA: Diagnosis not present

## 2019-08-05 DIAGNOSIS — N898 Other specified noninflammatory disorders of vagina: Secondary | ICD-10-CM | POA: Diagnosis not present

## 2019-08-05 DIAGNOSIS — Z7984 Long term (current) use of oral hypoglycemic drugs: Secondary | ICD-10-CM | POA: Diagnosis not present

## 2019-08-05 DIAGNOSIS — E785 Hyperlipidemia, unspecified: Secondary | ICD-10-CM | POA: Diagnosis not present

## 2019-08-10 DIAGNOSIS — R233 Spontaneous ecchymoses: Secondary | ICD-10-CM | POA: Diagnosis not present

## 2019-09-06 DIAGNOSIS — G4733 Obstructive sleep apnea (adult) (pediatric): Secondary | ICD-10-CM | POA: Diagnosis not present

## 2019-09-21 DIAGNOSIS — G4733 Obstructive sleep apnea (adult) (pediatric): Secondary | ICD-10-CM | POA: Diagnosis not present

## 2019-10-22 DIAGNOSIS — G4733 Obstructive sleep apnea (adult) (pediatric): Secondary | ICD-10-CM | POA: Diagnosis not present

## 2019-11-05 ENCOUNTER — Other Ambulatory Visit: Payer: Self-pay | Admitting: Internal Medicine

## 2019-11-05 DIAGNOSIS — Z1231 Encounter for screening mammogram for malignant neoplasm of breast: Secondary | ICD-10-CM

## 2019-11-21 DIAGNOSIS — G4733 Obstructive sleep apnea (adult) (pediatric): Secondary | ICD-10-CM | POA: Diagnosis not present

## 2019-12-24 ENCOUNTER — Other Ambulatory Visit: Payer: Self-pay

## 2019-12-24 ENCOUNTER — Ambulatory Visit
Admission: RE | Admit: 2019-12-24 | Discharge: 2019-12-24 | Disposition: A | Discharge status: Home / Self Care | Payer: Medicare HMO | Source: Ambulatory Visit | Attending: Internal Medicine | Admitting: Internal Medicine

## 2019-12-24 DIAGNOSIS — Z1231 Encounter for screening mammogram for malignant neoplasm of breast: Secondary | ICD-10-CM | POA: Diagnosis not present

## 2019-12-29 DIAGNOSIS — R69 Illness, unspecified: Secondary | ICD-10-CM | POA: Diagnosis not present

## 2020-01-21 ENCOUNTER — Ambulatory Visit: Payer: Medicare HMO | Attending: Internal Medicine

## 2020-01-21 DIAGNOSIS — Z23 Encounter for immunization: Secondary | ICD-10-CM

## 2020-01-21 NOTE — Progress Notes (Signed)
   Covid-19 Vaccination Clinic  Name:  Renee Stout    MRN: [id number] DOB: [birth year]  01/21/2020  Renee Stout was observed post Covid-19 immunization for 15 minutes without incidence. She was provided with Vaccine Information Sheet and instruction to access the V-Safe system.   Renee Stout was instructed to call 911 with any severe reactions post vaccine: Marland Kitchen Difficulty breathing  . Swelling of your face and throat  . A fast heartbeat  . A bad rash all over your body  . Dizziness and weakness    Immunizations Administered    Name Date Dose VIS Date Route   Pfizer COVID-19 Vaccine 01/21/2020 11:06 AM 0.3 mL 11/05/2019 Intramuscular   Manufacturer: [place]   Lot: [id number]   [place]: [id number]

## 2020-02-03 DIAGNOSIS — E1121 Type 2 diabetes mellitus with diabetic nephropathy: Secondary | ICD-10-CM | POA: Diagnosis not present

## 2020-02-03 DIAGNOSIS — Z1389 Encounter for screening for other disorder: Secondary | ICD-10-CM | POA: Diagnosis not present

## 2020-02-03 DIAGNOSIS — Z0000 Encounter for general adult medical examination without abnormal findings: Secondary | ICD-10-CM | POA: Diagnosis not present

## 2020-02-03 DIAGNOSIS — G4733 Obstructive sleep apnea (adult) (pediatric): Secondary | ICD-10-CM | POA: Diagnosis not present

## 2020-02-03 DIAGNOSIS — E785 Hyperlipidemia, unspecified: Secondary | ICD-10-CM | POA: Diagnosis not present

## 2020-02-03 DIAGNOSIS — M85852 Other specified disorders of bone density and structure, left thigh: Secondary | ICD-10-CM | POA: Diagnosis not present

## 2020-02-03 DIAGNOSIS — E039 Hypothyroidism, unspecified: Secondary | ICD-10-CM | POA: Diagnosis not present

## 2020-02-05 DIAGNOSIS — Z7984 Long term (current) use of oral hypoglycemic drugs: Secondary | ICD-10-CM | POA: Diagnosis not present

## 2020-02-05 DIAGNOSIS — R69 Illness, unspecified: Secondary | ICD-10-CM | POA: Diagnosis not present

## 2020-02-05 DIAGNOSIS — Z85828 Personal history of other malignant neoplasm of skin: Secondary | ICD-10-CM | POA: Diagnosis not present

## 2020-02-05 DIAGNOSIS — I10 Essential (primary) hypertension: Secondary | ICD-10-CM | POA: Diagnosis not present

## 2020-02-05 DIAGNOSIS — E039 Hypothyroidism, unspecified: Secondary | ICD-10-CM | POA: Diagnosis not present

## 2020-02-05 DIAGNOSIS — E785 Hyperlipidemia, unspecified: Secondary | ICD-10-CM | POA: Diagnosis not present

## 2020-02-05 DIAGNOSIS — E1165 Type 2 diabetes mellitus with hyperglycemia: Secondary | ICD-10-CM | POA: Diagnosis not present

## 2020-02-05 DIAGNOSIS — R32 Unspecified urinary incontinence: Secondary | ICD-10-CM | POA: Diagnosis not present

## 2020-02-05 DIAGNOSIS — Z833 Family history of diabetes mellitus: Secondary | ICD-10-CM | POA: Diagnosis not present

## 2020-02-05 DIAGNOSIS — G4730 Sleep apnea, unspecified: Secondary | ICD-10-CM | POA: Diagnosis not present

## 2020-02-15 ENCOUNTER — Ambulatory Visit: Payer: Medicare HMO | Attending: Internal Medicine

## 2020-02-15 DIAGNOSIS — Z23 Encounter for immunization: Secondary | ICD-10-CM

## 2020-02-15 NOTE — Progress Notes (Signed)
   Covid-19 Vaccination Clinic  Name:  Renee Stout    MRN: [id number] DOB: 05-15-1941  02/15/2020  [name] was observed post Covid-19 immunization for 15 minutes without incident. She was provided with Vaccine Information Sheet and instruction to access the V-Safe system.   [name] was instructed to call 911 with any severe reactions post vaccine: Marland Kitchen Difficulty breathing  . Swelling of face and throat  . A fast heartbeat  . A bad rash all over body  . Dizziness and weakness   Immunizations Administered    Name Date Dose VIS Date Route   Pfizer COVID-19 Vaccine 02/15/2020  1:24 PM 0.3 mL 11/05/2019 Intramuscular   Manufacturer: [employer]   Lot: [id number]   [place]: [id number]

## 2020-05-05 DIAGNOSIS — E119 Type 2 diabetes mellitus without complications: Secondary | ICD-10-CM | POA: Diagnosis not present

## 2020-05-05 DIAGNOSIS — H5211 Myopia, right eye: Secondary | ICD-10-CM | POA: Diagnosis not present

## 2020-05-05 DIAGNOSIS — H52212 Irregular astigmatism, left eye: Secondary | ICD-10-CM | POA: Diagnosis not present

## 2020-05-05 DIAGNOSIS — H2513 Age-related nuclear cataract, bilateral: Secondary | ICD-10-CM | POA: Diagnosis not present

## 2020-07-11 DIAGNOSIS — R69 Illness, unspecified: Secondary | ICD-10-CM | POA: Diagnosis not present

## 2020-07-18 DIAGNOSIS — G4733 Obstructive sleep apnea (adult) (pediatric): Secondary | ICD-10-CM | POA: Diagnosis not present

## 2020-08-07 DIAGNOSIS — E1121 Type 2 diabetes mellitus with diabetic nephropathy: Secondary | ICD-10-CM | POA: Diagnosis not present

## 2020-08-07 DIAGNOSIS — Z23 Encounter for immunization: Secondary | ICD-10-CM | POA: Diagnosis not present

## 2020-08-18 DIAGNOSIS — G4733 Obstructive sleep apnea (adult) (pediatric): Secondary | ICD-10-CM | POA: Diagnosis not present

## 2020-09-06 DIAGNOSIS — G4733 Obstructive sleep apnea (adult) (pediatric): Secondary | ICD-10-CM | POA: Diagnosis not present

## 2020-09-17 DIAGNOSIS — G4733 Obstructive sleep apnea (adult) (pediatric): Secondary | ICD-10-CM | POA: Diagnosis not present

## 2020-10-23 DIAGNOSIS — H2513 Age-related nuclear cataract, bilateral: Secondary | ICD-10-CM | POA: Diagnosis not present

## 2020-10-23 DIAGNOSIS — H2512 Age-related nuclear cataract, left eye: Secondary | ICD-10-CM | POA: Diagnosis not present

## 2020-10-23 DIAGNOSIS — H25043 Posterior subcapsular polar age-related cataract, bilateral: Secondary | ICD-10-CM | POA: Diagnosis not present

## 2020-10-23 DIAGNOSIS — H43813 Vitreous degeneration, bilateral: Secondary | ICD-10-CM | POA: Diagnosis not present

## 2020-10-23 DIAGNOSIS — E119 Type 2 diabetes mellitus without complications: Secondary | ICD-10-CM | POA: Diagnosis not present

## 2020-11-21 DIAGNOSIS — H2512 Age-related nuclear cataract, left eye: Secondary | ICD-10-CM | POA: Diagnosis not present

## 2020-11-21 DIAGNOSIS — H2511 Age-related nuclear cataract, right eye: Secondary | ICD-10-CM | POA: Diagnosis not present

## 2020-11-28 DIAGNOSIS — H2511 Age-related nuclear cataract, right eye: Secondary | ICD-10-CM | POA: Diagnosis not present

## 2020-12-04 DIAGNOSIS — Z961 Presence of intraocular lens: Secondary | ICD-10-CM | POA: Diagnosis not present

## 2020-12-04 DIAGNOSIS — H2512 Age-related nuclear cataract, left eye: Secondary | ICD-10-CM | POA: Diagnosis not present

## 2020-12-04 DIAGNOSIS — H2511 Age-related nuclear cataract, right eye: Secondary | ICD-10-CM | POA: Diagnosis not present

## 2020-12-25 DIAGNOSIS — Z0100 Encounter for examination of eyes and vision without abnormal findings: Secondary | ICD-10-CM | POA: Diagnosis not present

## 2021-01-01 ENCOUNTER — Other Ambulatory Visit: Payer: Self-pay | Admitting: Internal Medicine

## 2021-01-01 DIAGNOSIS — Z1231 Encounter for screening mammogram for malignant neoplasm of breast: Secondary | ICD-10-CM

## 2021-01-08 DIAGNOSIS — Z7984 Long term (current) use of oral hypoglycemic drugs: Secondary | ICD-10-CM | POA: Diagnosis not present

## 2021-01-08 DIAGNOSIS — E785 Hyperlipidemia, unspecified: Secondary | ICD-10-CM | POA: Diagnosis not present

## 2021-01-08 DIAGNOSIS — E119 Type 2 diabetes mellitus without complications: Secondary | ICD-10-CM | POA: Diagnosis not present

## 2021-01-08 DIAGNOSIS — I10 Essential (primary) hypertension: Secondary | ICD-10-CM | POA: Diagnosis not present

## 2021-01-08 DIAGNOSIS — Z7982 Long term (current) use of aspirin: Secondary | ICD-10-CM | POA: Diagnosis not present

## 2021-01-08 DIAGNOSIS — Z8249 Family history of ischemic heart disease and other diseases of the circulatory system: Secondary | ICD-10-CM | POA: Diagnosis not present

## 2021-01-08 DIAGNOSIS — M8580 Other specified disorders of bone density and structure, unspecified site: Secondary | ICD-10-CM | POA: Diagnosis not present

## 2021-01-08 DIAGNOSIS — R32 Unspecified urinary incontinence: Secondary | ICD-10-CM | POA: Diagnosis not present

## 2021-01-08 DIAGNOSIS — G4733 Obstructive sleep apnea (adult) (pediatric): Secondary | ICD-10-CM | POA: Diagnosis not present

## 2021-01-08 DIAGNOSIS — E039 Hypothyroidism, unspecified: Secondary | ICD-10-CM | POA: Diagnosis not present

## 2021-01-10 ENCOUNTER — Ambulatory Visit
Admission: RE | Admit: 2021-01-10 | Discharge: 2021-01-10 | Disposition: A | Discharge status: Home / Self Care | Payer: Medicare HMO | Source: Ambulatory Visit | Attending: Internal Medicine | Admitting: Internal Medicine

## 2021-01-10 ENCOUNTER — Other Ambulatory Visit: Payer: Self-pay

## 2021-01-10 DIAGNOSIS — Z1231 Encounter for screening mammogram for malignant neoplasm of breast: Secondary | ICD-10-CM

## 2021-01-16 ENCOUNTER — Other Ambulatory Visit: Payer: Self-pay | Admitting: Internal Medicine

## 2021-01-16 DIAGNOSIS — R928 Other abnormal and inconclusive findings on diagnostic imaging of breast: Secondary | ICD-10-CM

## 2021-01-17 DIAGNOSIS — G4733 Obstructive sleep apnea (adult) (pediatric): Secondary | ICD-10-CM | POA: Diagnosis not present

## 2021-01-30 ENCOUNTER — Ambulatory Visit
Admission: RE | Admit: 2021-01-30 | Discharge: 2021-01-30 | Disposition: A | Discharge status: Home / Self Care | Payer: Medicare HMO | Source: Ambulatory Visit | Attending: Internal Medicine | Admitting: Internal Medicine

## 2021-01-30 ENCOUNTER — Other Ambulatory Visit: Payer: Self-pay | Admitting: Internal Medicine

## 2021-01-30 DIAGNOSIS — R928 Other abnormal and inconclusive findings on diagnostic imaging of breast: Secondary | ICD-10-CM

## 2021-01-30 DIAGNOSIS — R922 Inconclusive mammogram: Secondary | ICD-10-CM | POA: Diagnosis not present

## 2021-01-30 DIAGNOSIS — N6489 Other specified disorders of breast: Secondary | ICD-10-CM | POA: Diagnosis not present

## 2021-02-07 ENCOUNTER — Other Ambulatory Visit: Payer: Self-pay

## 2021-02-07 ENCOUNTER — Ambulatory Visit
Admission: RE | Admit: 2021-02-07 | Discharge: 2021-02-07 | Disposition: A | Discharge status: Home / Self Care | Payer: Medicare HMO | Source: Ambulatory Visit | Attending: Internal Medicine | Admitting: Internal Medicine

## 2021-02-07 DIAGNOSIS — R928 Other abnormal and inconclusive findings on diagnostic imaging of breast: Secondary | ICD-10-CM

## 2021-02-07 HISTORY — PX: BREAST BIOPSY: SHX20

## 2021-02-14 DIAGNOSIS — G4733 Obstructive sleep apnea (adult) (pediatric): Secondary | ICD-10-CM | POA: Diagnosis not present

## 2021-02-15 DIAGNOSIS — Z7189 Other specified counseling: Secondary | ICD-10-CM | POA: Diagnosis not present

## 2021-02-15 DIAGNOSIS — E1121 Type 2 diabetes mellitus with diabetic nephropathy: Secondary | ICD-10-CM | POA: Diagnosis not present

## 2021-02-15 DIAGNOSIS — E785 Hyperlipidemia, unspecified: Secondary | ICD-10-CM | POA: Diagnosis not present

## 2021-02-15 DIAGNOSIS — E039 Hypothyroidism, unspecified: Secondary | ICD-10-CM | POA: Diagnosis not present

## 2021-02-15 DIAGNOSIS — M85852 Other specified disorders of bone density and structure, left thigh: Secondary | ICD-10-CM | POA: Diagnosis not present

## 2021-02-15 DIAGNOSIS — Z7984 Long term (current) use of oral hypoglycemic drugs: Secondary | ICD-10-CM | POA: Diagnosis not present

## 2021-02-15 DIAGNOSIS — Z0000 Encounter for general adult medical examination without abnormal findings: Secondary | ICD-10-CM | POA: Diagnosis not present

## 2021-02-15 DIAGNOSIS — G4733 Obstructive sleep apnea (adult) (pediatric): Secondary | ICD-10-CM | POA: Diagnosis not present

## 2021-02-15 DIAGNOSIS — N1831 Chronic kidney disease, stage 3a: Secondary | ICD-10-CM | POA: Diagnosis not present

## 2021-02-15 DIAGNOSIS — Z1389 Encounter for screening for other disorder: Secondary | ICD-10-CM | POA: Diagnosis not present

## 2021-02-15 DIAGNOSIS — N6321 Unspecified lump in the left breast, upper outer quadrant: Secondary | ICD-10-CM | POA: Diagnosis not present

## 2021-02-16 ENCOUNTER — Other Ambulatory Visit: Payer: Self-pay | Admitting: Internal Medicine

## 2021-02-16 DIAGNOSIS — M8580 Other specified disorders of bone density and structure, unspecified site: Secondary | ICD-10-CM

## 2021-03-08 ENCOUNTER — Other Ambulatory Visit: Payer: Self-pay | Admitting: Internal Medicine

## 2021-03-08 DIAGNOSIS — Z09 Encounter for follow-up examination after completed treatment for conditions other than malignant neoplasm: Secondary | ICD-10-CM

## 2021-03-14 ENCOUNTER — Other Ambulatory Visit: Payer: Self-pay | Admitting: Internal Medicine

## 2021-03-14 DIAGNOSIS — N6489 Other specified disorders of breast: Secondary | ICD-10-CM

## 2021-03-17 DIAGNOSIS — G4733 Obstructive sleep apnea (adult) (pediatric): Secondary | ICD-10-CM | POA: Diagnosis not present

## 2021-04-27 DIAGNOSIS — U071 COVID-19: Secondary | ICD-10-CM | POA: Diagnosis not present

## 2021-08-13 ENCOUNTER — Ambulatory Visit
Admission: RE | Admit: 2021-08-13 | Discharge: 2021-08-13 | Disposition: A | Discharge status: Home / Self Care | Payer: Medicare HMO | Source: Ambulatory Visit | Attending: Internal Medicine | Admitting: Internal Medicine

## 2021-08-13 ENCOUNTER — Other Ambulatory Visit: Payer: Self-pay

## 2021-08-13 ENCOUNTER — Other Ambulatory Visit: Payer: Self-pay | Admitting: Internal Medicine

## 2021-08-13 DIAGNOSIS — N6489 Other specified disorders of breast: Secondary | ICD-10-CM

## 2021-08-13 DIAGNOSIS — R922 Inconclusive mammogram: Secondary | ICD-10-CM | POA: Diagnosis not present

## 2021-08-15 DIAGNOSIS — L989 Disorder of the skin and subcutaneous tissue, unspecified: Secondary | ICD-10-CM | POA: Diagnosis not present

## 2021-08-15 DIAGNOSIS — E1169 Type 2 diabetes mellitus with other specified complication: Secondary | ICD-10-CM | POA: Diagnosis not present

## 2021-08-15 DIAGNOSIS — L304 Erythema intertrigo: Secondary | ICD-10-CM | POA: Diagnosis not present

## 2021-08-15 DIAGNOSIS — Z7984 Long term (current) use of oral hypoglycemic drugs: Secondary | ICD-10-CM | POA: Diagnosis not present

## 2021-08-15 DIAGNOSIS — N1831 Chronic kidney disease, stage 3a: Secondary | ICD-10-CM | POA: Diagnosis not present

## 2021-08-15 DIAGNOSIS — Z8616 Personal history of COVID-19: Secondary | ICD-10-CM | POA: Diagnosis not present

## 2021-08-15 DIAGNOSIS — E1121 Type 2 diabetes mellitus with diabetic nephropathy: Secondary | ICD-10-CM | POA: Diagnosis not present

## 2021-08-15 DIAGNOSIS — E039 Hypothyroidism, unspecified: Secondary | ICD-10-CM | POA: Diagnosis not present

## 2021-08-21 ENCOUNTER — Ambulatory Visit
Admission: RE | Admit: 2021-08-21 | Discharge: 2021-08-21 | Disposition: A | Discharge status: Home / Self Care | Payer: Medicare HMO | Source: Ambulatory Visit | Attending: Internal Medicine | Admitting: Internal Medicine

## 2021-08-21 ENCOUNTER — Other Ambulatory Visit: Payer: Self-pay

## 2021-08-21 DIAGNOSIS — Z780 Asymptomatic menopausal state: Secondary | ICD-10-CM | POA: Diagnosis not present

## 2021-08-21 DIAGNOSIS — M8589 Other specified disorders of bone density and structure, multiple sites: Secondary | ICD-10-CM | POA: Diagnosis not present

## 2021-08-21 DIAGNOSIS — M8580 Other specified disorders of bone density and structure, unspecified site: Secondary | ICD-10-CM

## 2021-08-27 DIAGNOSIS — G4733 Obstructive sleep apnea (adult) (pediatric): Secondary | ICD-10-CM | POA: Diagnosis not present

## 2021-09-04 DIAGNOSIS — L309 Dermatitis, unspecified: Secondary | ICD-10-CM | POA: Diagnosis not present

## 2021-09-12 DIAGNOSIS — G4733 Obstructive sleep apnea (adult) (pediatric): Secondary | ICD-10-CM | POA: Diagnosis not present

## 2021-09-20 ENCOUNTER — Other Ambulatory Visit: Payer: Self-pay

## 2021-09-20 DIAGNOSIS — L309 Dermatitis, unspecified: Secondary | ICD-10-CM | POA: Diagnosis not present

## 2021-10-30 DIAGNOSIS — L309 Dermatitis, unspecified: Secondary | ICD-10-CM | POA: Diagnosis not present

## 2021-11-23 DIAGNOSIS — R159 Full incontinence of feces: Secondary | ICD-10-CM | POA: Diagnosis not present

## 2022-01-14 ENCOUNTER — Ambulatory Visit
Admission: RE | Admit: 2022-01-14 | Discharge: 2022-01-14 | Disposition: A | Discharge status: Home / Self Care | Payer: Medicare HMO | Source: Ambulatory Visit | Attending: Internal Medicine | Admitting: Internal Medicine

## 2022-01-14 ENCOUNTER — Other Ambulatory Visit: Payer: Self-pay

## 2022-01-14 DIAGNOSIS — N6489 Other specified disorders of breast: Secondary | ICD-10-CM

## 2022-01-14 DIAGNOSIS — R922 Inconclusive mammogram: Secondary | ICD-10-CM | POA: Diagnosis not present

## 2022-01-25 DIAGNOSIS — E119 Type 2 diabetes mellitus without complications: Secondary | ICD-10-CM | POA: Diagnosis not present

## 2022-01-25 DIAGNOSIS — H5211 Myopia, right eye: Secondary | ICD-10-CM | POA: Diagnosis not present

## 2022-01-25 DIAGNOSIS — Z9842 Cataract extraction status, left eye: Secondary | ICD-10-CM | POA: Diagnosis not present

## 2022-01-25 DIAGNOSIS — Z9841 Cataract extraction status, right eye: Secondary | ICD-10-CM | POA: Diagnosis not present

## 2022-02-18 DIAGNOSIS — E039 Hypothyroidism, unspecified: Secondary | ICD-10-CM | POA: Diagnosis not present

## 2022-02-18 DIAGNOSIS — Z882 Allergy status to sulfonamides status: Secondary | ICD-10-CM | POA: Diagnosis not present

## 2022-02-18 DIAGNOSIS — E11620 Type 2 diabetes mellitus with diabetic dermatitis: Secondary | ICD-10-CM | POA: Diagnosis not present

## 2022-02-18 DIAGNOSIS — Z833 Family history of diabetes mellitus: Secondary | ICD-10-CM | POA: Diagnosis not present

## 2022-02-18 DIAGNOSIS — R32 Unspecified urinary incontinence: Secondary | ICD-10-CM | POA: Diagnosis not present

## 2022-02-18 DIAGNOSIS — Z7984 Long term (current) use of oral hypoglycemic drugs: Secondary | ICD-10-CM | POA: Diagnosis not present

## 2022-02-18 DIAGNOSIS — R030 Elevated blood-pressure reading, without diagnosis of hypertension: Secondary | ICD-10-CM | POA: Diagnosis not present

## 2022-02-18 DIAGNOSIS — Z87891 Personal history of nicotine dependence: Secondary | ICD-10-CM | POA: Diagnosis not present

## 2022-02-18 DIAGNOSIS — E785 Hyperlipidemia, unspecified: Secondary | ICD-10-CM | POA: Diagnosis not present

## 2022-02-21 DIAGNOSIS — R197 Diarrhea, unspecified: Secondary | ICD-10-CM | POA: Diagnosis not present

## 2022-02-21 DIAGNOSIS — R159 Full incontinence of feces: Secondary | ICD-10-CM | POA: Diagnosis not present

## 2022-02-21 DIAGNOSIS — R152 Fecal urgency: Secondary | ICD-10-CM | POA: Diagnosis not present

## 2022-04-05 DIAGNOSIS — E039 Hypothyroidism, unspecified: Secondary | ICD-10-CM | POA: Diagnosis not present

## 2022-04-16 DIAGNOSIS — E039 Hypothyroidism, unspecified: Secondary | ICD-10-CM | POA: Diagnosis not present

## 2022-04-16 DIAGNOSIS — M85852 Other specified disorders of bone density and structure, left thigh: Secondary | ICD-10-CM | POA: Diagnosis not present

## 2022-04-16 DIAGNOSIS — E785 Hyperlipidemia, unspecified: Secondary | ICD-10-CM | POA: Diagnosis not present

## 2022-04-16 DIAGNOSIS — E1121 Type 2 diabetes mellitus with diabetic nephropathy: Secondary | ICD-10-CM | POA: Diagnosis not present

## 2022-04-16 DIAGNOSIS — R159 Full incontinence of feces: Secondary | ICD-10-CM | POA: Diagnosis not present

## 2022-04-16 DIAGNOSIS — Z1331 Encounter for screening for depression: Secondary | ICD-10-CM | POA: Diagnosis not present

## 2022-04-16 DIAGNOSIS — R3915 Urgency of urination: Secondary | ICD-10-CM | POA: Diagnosis not present

## 2022-04-16 DIAGNOSIS — Z0000 Encounter for general adult medical examination without abnormal findings: Secondary | ICD-10-CM | POA: Diagnosis not present

## 2022-04-16 DIAGNOSIS — E1169 Type 2 diabetes mellitus with other specified complication: Secondary | ICD-10-CM | POA: Diagnosis not present

## 2022-05-03 DIAGNOSIS — G4733 Obstructive sleep apnea (adult) (pediatric): Secondary | ICD-10-CM | POA: Diagnosis not present

## 2022-05-15 DIAGNOSIS — H903 Sensorineural hearing loss, bilateral: Secondary | ICD-10-CM | POA: Diagnosis not present

## 2022-06-02 DIAGNOSIS — G4733 Obstructive sleep apnea (adult) (pediatric): Secondary | ICD-10-CM | POA: Diagnosis not present

## 2022-07-03 DIAGNOSIS — G4733 Obstructive sleep apnea (adult) (pediatric): Secondary | ICD-10-CM | POA: Diagnosis not present

## 2022-09-03 IMAGING — MG MM DIGITAL SCREENING BILAT W/ TOMO AND CAD
8 series · 8 of 24 positions shown · non-contrast
Comparison: Previous exam(s).

CLINICAL DATA: Screening.

EXAM:
DIGITAL SCREENING BILATERAL MAMMOGRAM WITH TOMOSYNTHESIS AND CAD
TECHNIQUE: Bilateral screening digital craniocaudal and mediolateral oblique
mammograms were obtained. Bilateral screening digital breast
tomosynthesis was performed. The images were evaluated with
computer-aided detection.

[L CC synth-2D]
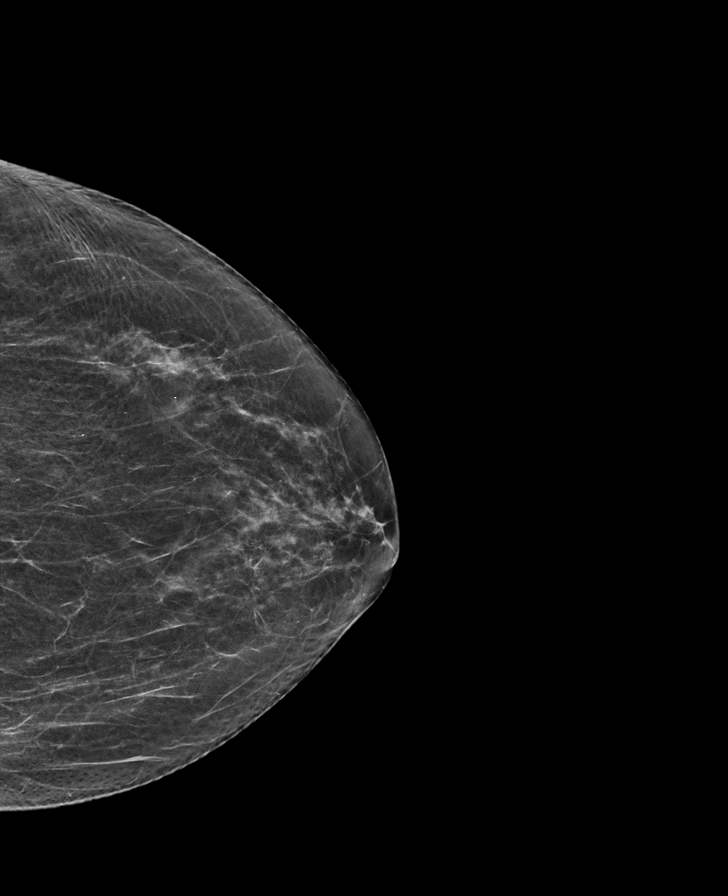

[L MLO synth-2D]
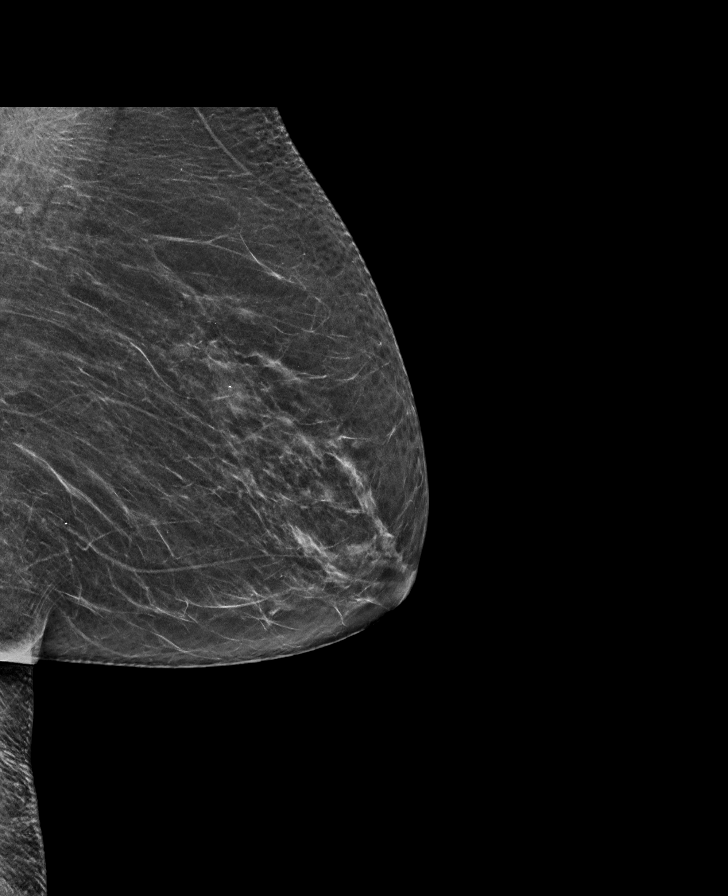

[R CC synth-2D]
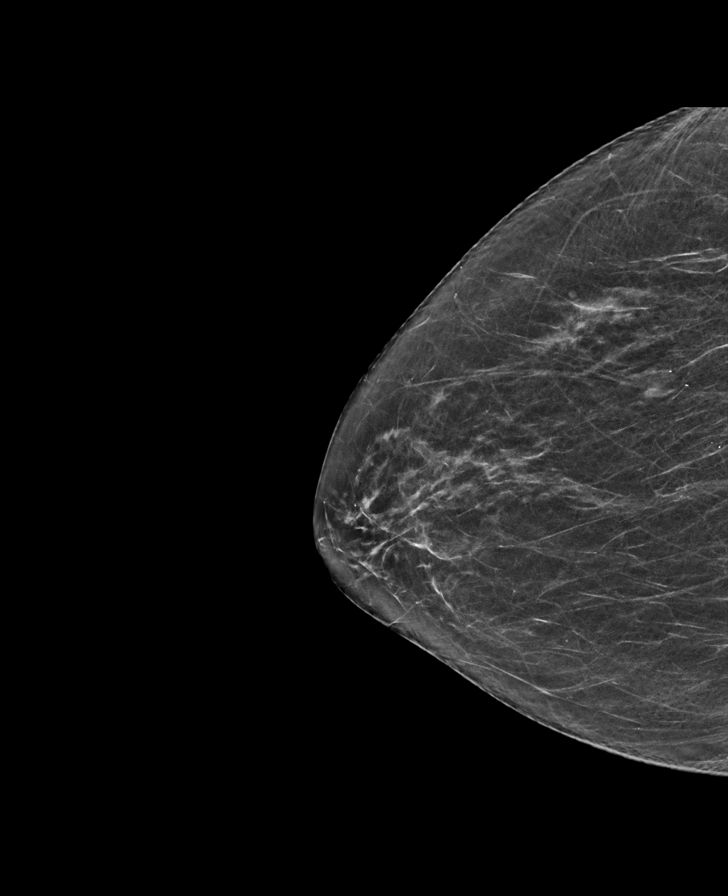

[R MLO synth-2D]
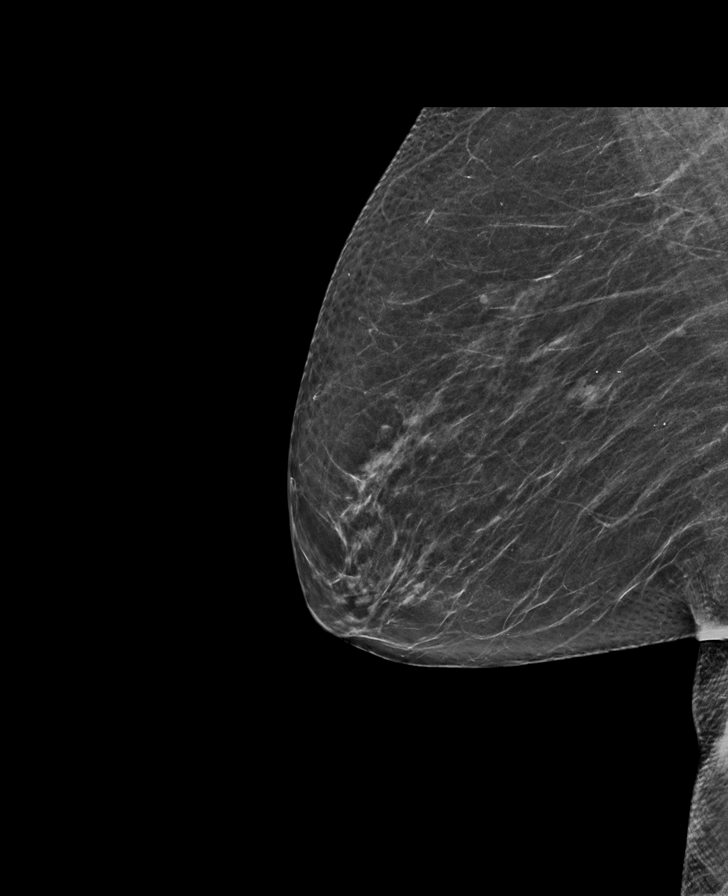

[R MLO tomo · tomo slice 29/58.0]
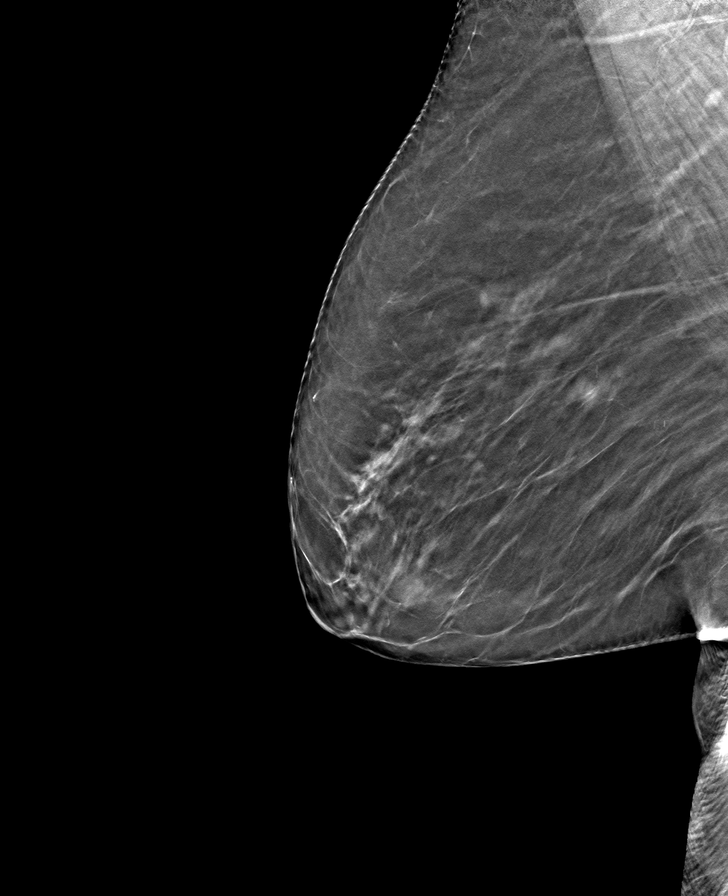

[L MLO tomo · tomo slice 29/57.0]
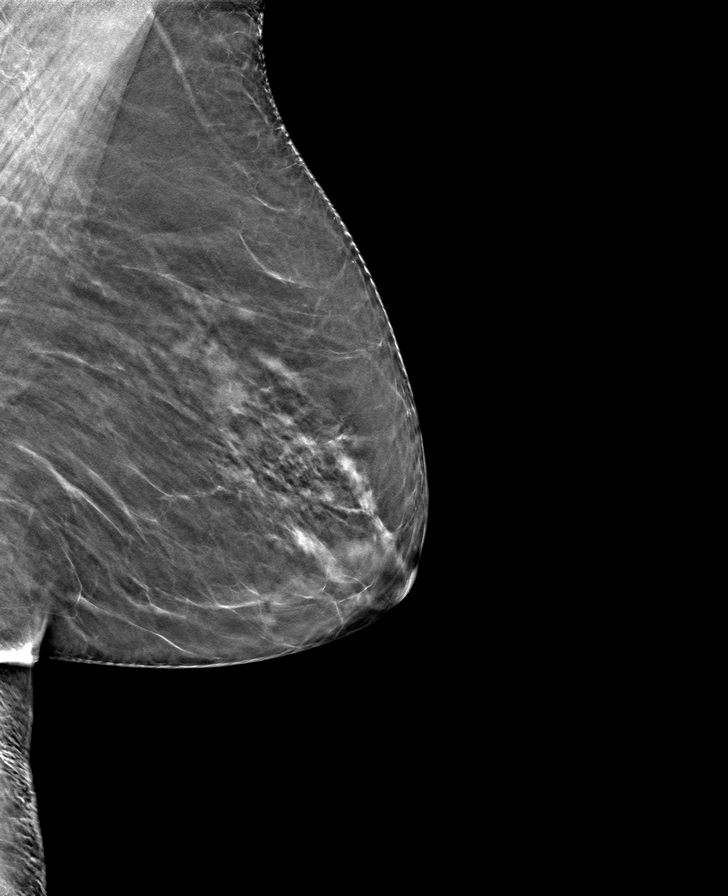

[R CC tomo · tomo slice 29/57.0]
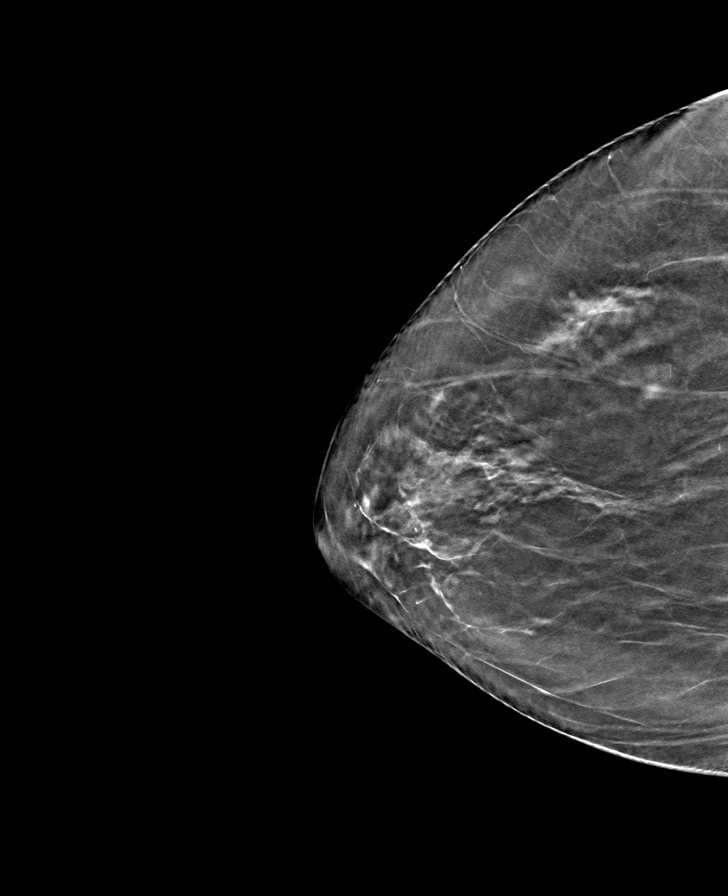

[L CC tomo · tomo slice 29/56.0]
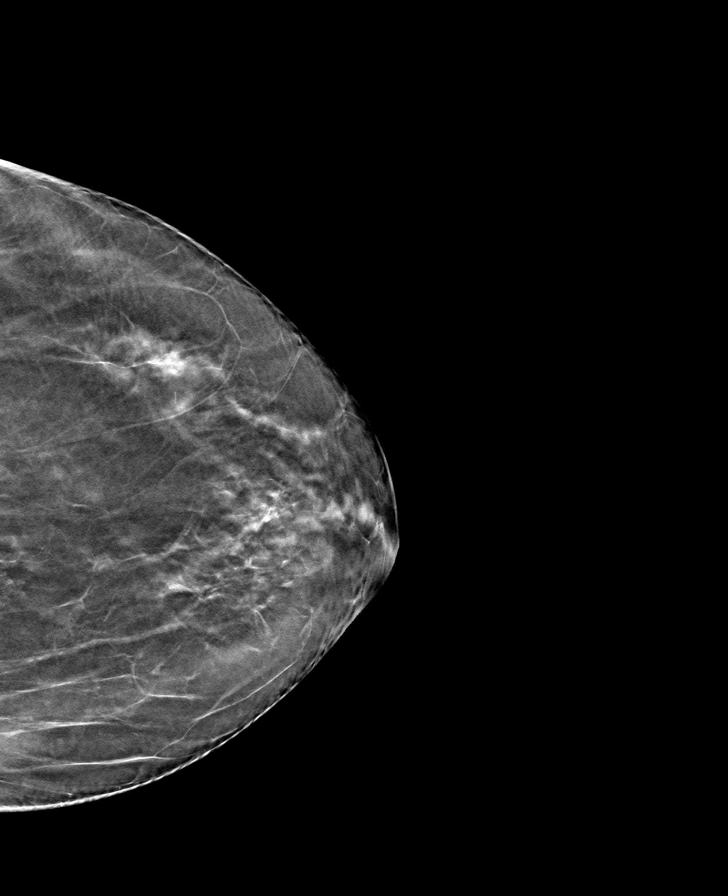

[8 of 24 positions shown; findings below may reference images not displayed]

ACR Breast Density Category b: There are scattered areas of
fibroglandular density.
FINDINGS: In the left breast, a possible asymmetry warrants further
evaluation. In the right breast, no findings suspicious for
malignancy.
IMPRESSION: Further evaluation is suggested for possible asymmetry in the left
breast.

RECOMMENDATION:
Diagnostic mammogram and possibly ultrasound of the left breast.
(Code:[id number])

The patient will be contacted regarding the findings, and additional
imaging will be scheduled.

BI-RADS CATEGORY  0: Incomplete. Need additional imaging evaluation
and/or prior mammograms for comparison.

## 2022-09-16 DIAGNOSIS — G4733 Obstructive sleep apnea (adult) (pediatric): Secondary | ICD-10-CM | POA: Diagnosis not present

## 2022-09-23 IMAGING — US US BREAST*L* LIMITED INC AXILLA
1 series · 6 of 6 positions shown · non-contrast
Comparison: Previous exam(s).

CLINICAL DATA: Patient returns today to evaluate a possible LEFT
breast asymmetry questioned on recent screening mammogram

EXAM:
DIGITAL DIAGNOSTIC UNILATERAL LEFT MAMMOGRAM WITH TOMOSYNTHESIS AND
CAD; ULTRASOUND LEFT BREAST LIMITED
TECHNIQUE: Left digital diagnostic mammography and breast tomosynthesis was
performed. The images were evaluated with computer-aided detection.;
Targeted ultrasound examination of the left breast was performed

[Series 1: us breast*left* limited inc axilla · 0.06mm/px · 6 of 6 slices shown]
[im 1/6]
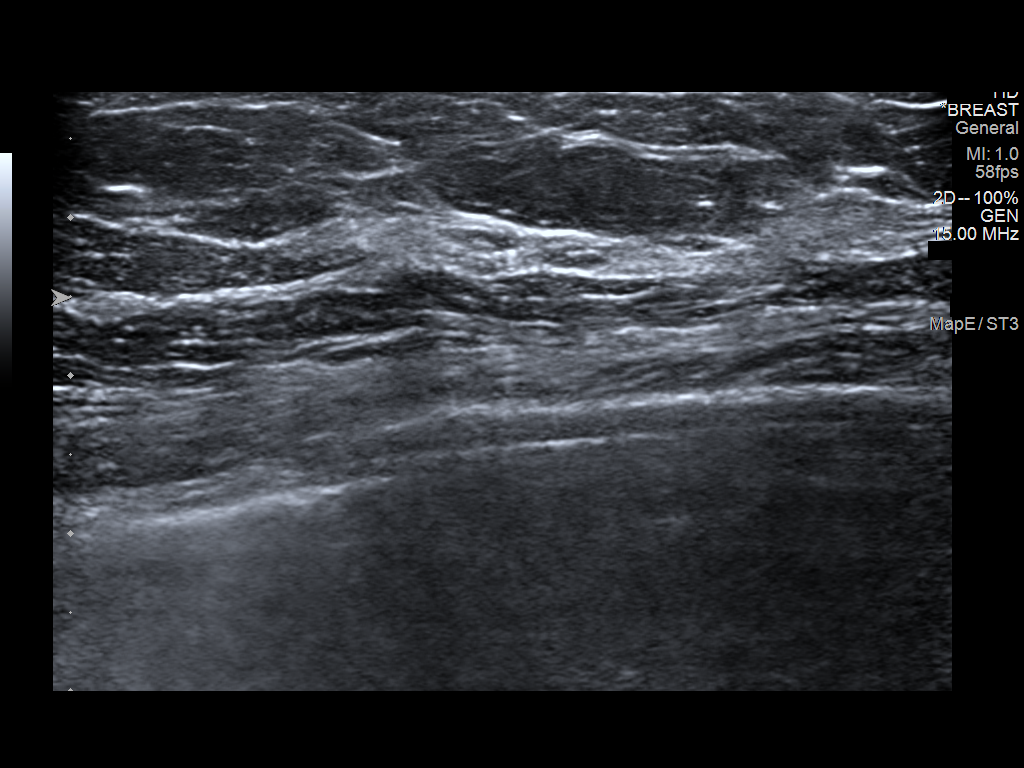
[im 2/6]
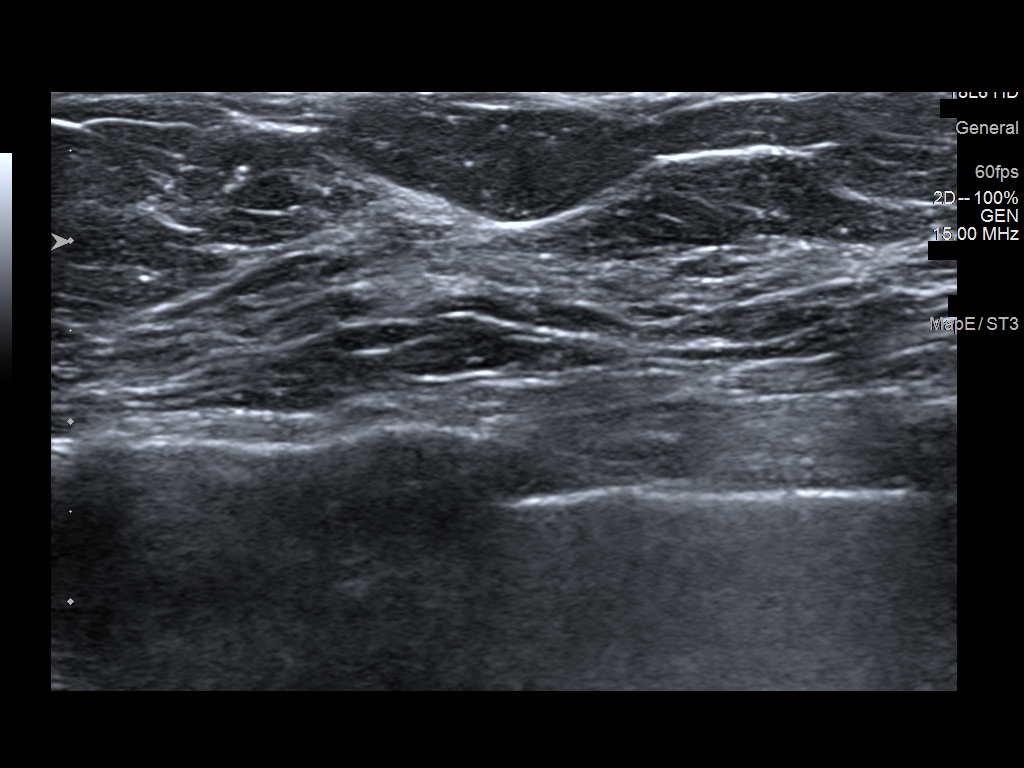
[im 3/6]
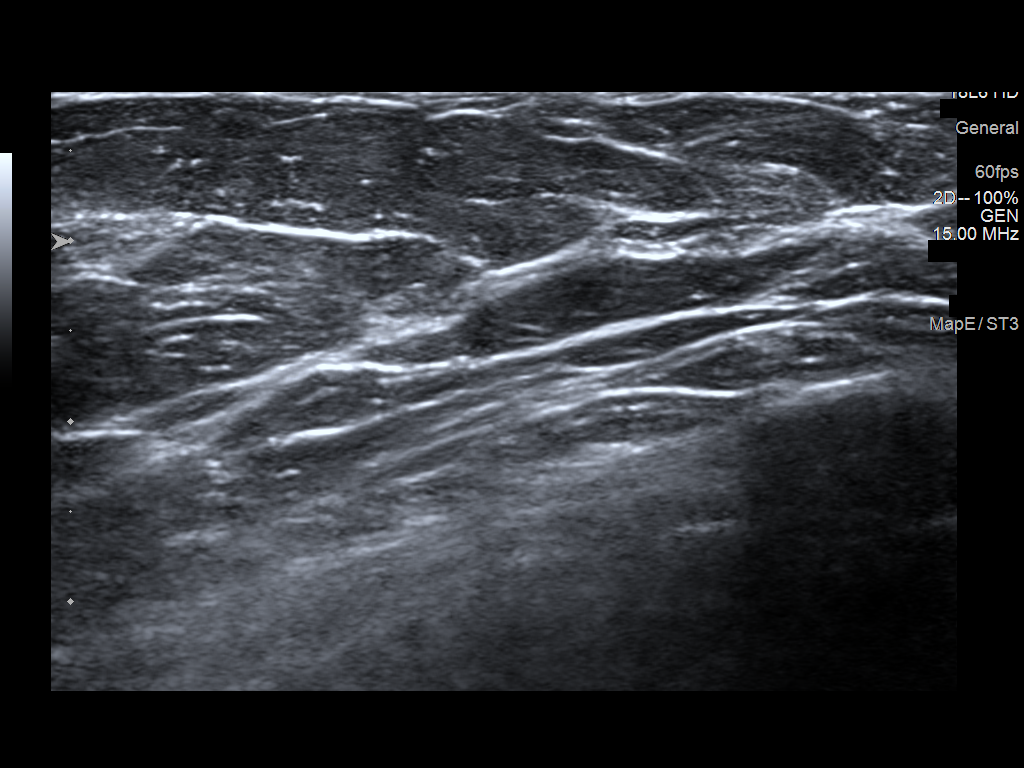
[im 4/6]
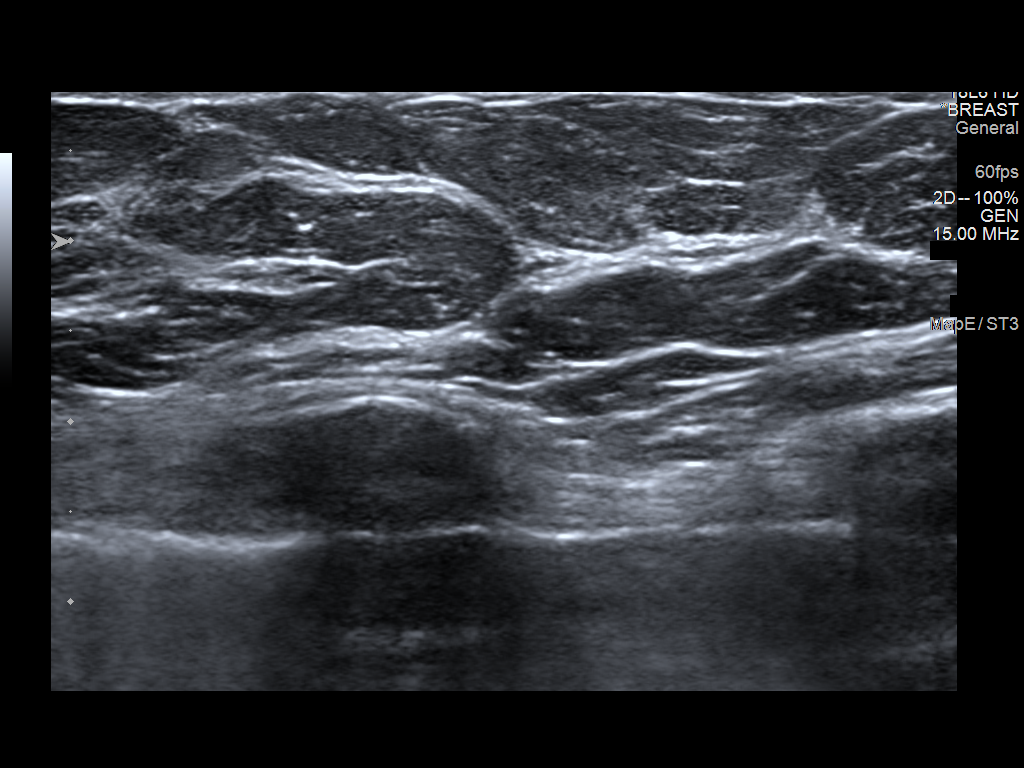
[im 5/6]
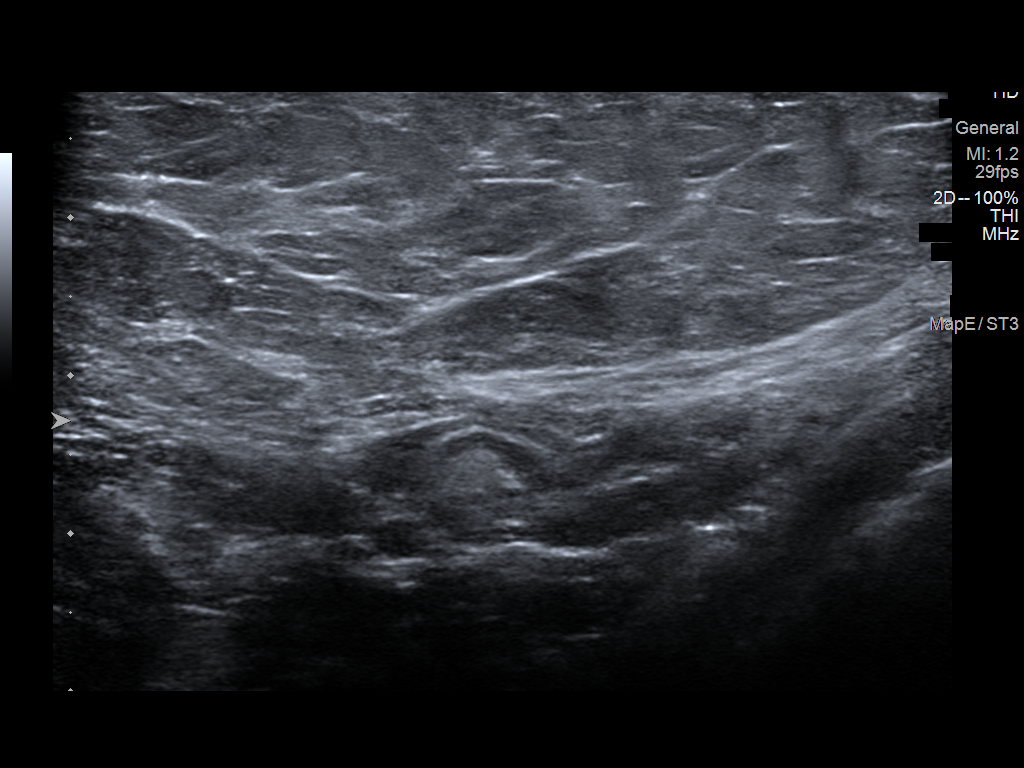
[im 6/6]
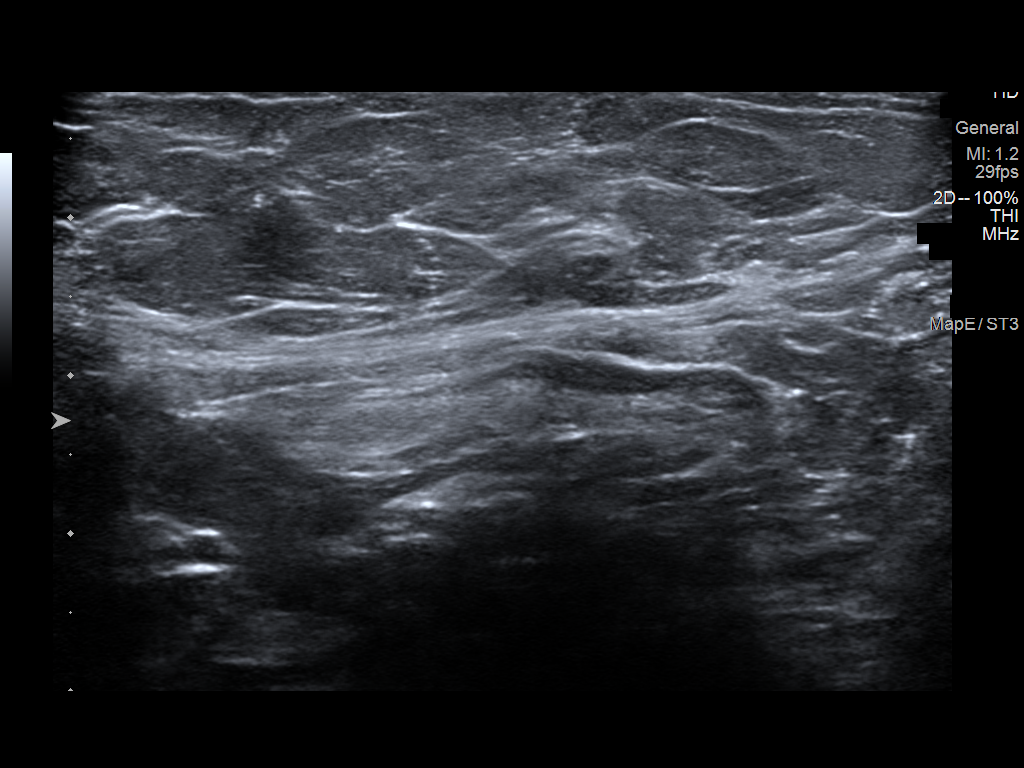

[6 of 6 positions shown; findings below may reference images not displayed]

ACR Breast Density Category b: There are scattered areas of
fibroglandular density.
FINDINGS: LEFT breast diagnostic mammogram: There is a persistent subtle
possible architectural distortion within the outer LEFT breast, seen
on spot compression CC view only (slice 24), not convincingly
reproduced on an XCCL view or true lateral view.

Targeted ultrasound is performed, evaluating the entire outer LEFT
breast, showing only normal fibroglandular tissues and fat lobules.
No solid or cystic mass. No distortion
IMPRESSION: Possible subtle architectural distortion within the outer LEFT
breast, reproduced today on spot compression CC view only (slice
24), without sonographic correlate. Stereotactic biopsy is
recommended.

RECOMMENDATION:
1. Stereotactic biopsy, with 3D tomosynthesis guidance, for the
possible subtle architectural distortion within the outer LEFT
breast.
2. If no reproducible distortion is seen on the day of biopsy,
recommend follow-up LEFT breast diagnostic mammogram in 6 months to
ensure stability. This possibility was discussed with the patient.

Stereotactic biopsy, with 3D tomosynthesis guidance, is scheduled
for [REDACTED].

I have discussed the findings and recommendations with the patient.
If applicable, a reminder letter will be sent to the patient
regarding the next appointment.

BI-RADS CATEGORY  4: Suspicious.

## 2022-10-23 DIAGNOSIS — G4733 Obstructive sleep apnea (adult) (pediatric): Secondary | ICD-10-CM | POA: Diagnosis not present

## 2022-10-23 DIAGNOSIS — E1169 Type 2 diabetes mellitus with other specified complication: Secondary | ICD-10-CM | POA: Diagnosis not present

## 2022-10-23 DIAGNOSIS — N1831 Chronic kidney disease, stage 3a: Secondary | ICD-10-CM | POA: Diagnosis not present

## 2022-10-23 DIAGNOSIS — E785 Hyperlipidemia, unspecified: Secondary | ICD-10-CM | POA: Diagnosis not present

## 2022-10-23 DIAGNOSIS — E039 Hypothyroidism, unspecified: Secondary | ICD-10-CM | POA: Diagnosis not present

## 2022-10-23 DIAGNOSIS — E1121 Type 2 diabetes mellitus with diabetic nephropathy: Secondary | ICD-10-CM | POA: Diagnosis not present

## 2022-10-31 DIAGNOSIS — L309 Dermatitis, unspecified: Secondary | ICD-10-CM | POA: Diagnosis not present

## 2022-10-31 DIAGNOSIS — N905 Atrophy of vulva: Secondary | ICD-10-CM | POA: Diagnosis not present

## 2022-10-31 DIAGNOSIS — N9089 Other specified noninflammatory disorders of vulva and perineum: Secondary | ICD-10-CM | POA: Diagnosis not present

## 2022-10-31 DIAGNOSIS — N952 Postmenopausal atrophic vaginitis: Secondary | ICD-10-CM | POA: Diagnosis not present

## 2022-11-13 DIAGNOSIS — G4733 Obstructive sleep apnea (adult) (pediatric): Secondary | ICD-10-CM | POA: Diagnosis not present

## 2022-12-03 ENCOUNTER — Other Ambulatory Visit: Payer: Self-pay | Admitting: Internal Medicine

## 2022-12-03 DIAGNOSIS — Z1231 Encounter for screening mammogram for malignant neoplasm of breast: Secondary | ICD-10-CM

## 2022-12-13 DIAGNOSIS — N905 Atrophy of vulva: Secondary | ICD-10-CM | POA: Diagnosis not present

## 2022-12-13 DIAGNOSIS — L309 Dermatitis, unspecified: Secondary | ICD-10-CM | POA: Diagnosis not present

## 2022-12-13 DIAGNOSIS — N952 Postmenopausal atrophic vaginitis: Secondary | ICD-10-CM | POA: Diagnosis not present

## 2022-12-14 DIAGNOSIS — G4733 Obstructive sleep apnea (adult) (pediatric): Secondary | ICD-10-CM | POA: Diagnosis not present

## 2022-12-24 DIAGNOSIS — H903 Sensorineural hearing loss, bilateral: Secondary | ICD-10-CM | POA: Diagnosis not present

## 2022-12-24 DIAGNOSIS — H6123 Impacted cerumen, bilateral: Secondary | ICD-10-CM | POA: Diagnosis not present

## 2022-12-31 DIAGNOSIS — H903 Sensorineural hearing loss, bilateral: Secondary | ICD-10-CM | POA: Diagnosis not present

## 2023-01-04 DIAGNOSIS — R69 Illness, unspecified: Secondary | ICD-10-CM | POA: Diagnosis not present

## 2023-01-08 DIAGNOSIS — H903 Sensorineural hearing loss, bilateral: Secondary | ICD-10-CM | POA: Diagnosis not present

## 2023-01-14 DIAGNOSIS — G4733 Obstructive sleep apnea (adult) (pediatric): Secondary | ICD-10-CM | POA: Diagnosis not present

## 2023-01-24 ENCOUNTER — Ambulatory Visit: Payer: Medicare HMO

## 2023-03-03 ENCOUNTER — Ambulatory Visit
Admission: RE | Admit: 2023-03-03 | Discharge: 2023-03-03 | Disposition: A | Discharge status: Home / Self Care | Payer: Medicare HMO | Source: Ambulatory Visit | Attending: Internal Medicine | Admitting: Internal Medicine

## 2023-03-03 DIAGNOSIS — Z1231 Encounter for screening mammogram for malignant neoplasm of breast: Secondary | ICD-10-CM

## 2023-03-10 DIAGNOSIS — E119 Type 2 diabetes mellitus without complications: Secondary | ICD-10-CM | POA: Diagnosis not present

## 2023-03-10 DIAGNOSIS — H5211 Myopia, right eye: Secondary | ICD-10-CM | POA: Diagnosis not present

## 2023-03-10 DIAGNOSIS — Z9842 Cataract extraction status, left eye: Secondary | ICD-10-CM | POA: Diagnosis not present

## 2023-03-10 DIAGNOSIS — Z9841 Cataract extraction status, right eye: Secondary | ICD-10-CM | POA: Diagnosis not present

## 2023-04-03 DIAGNOSIS — Z0100 Encounter for examination of eyes and vision without abnormal findings: Secondary | ICD-10-CM | POA: Diagnosis not present

## 2023-04-07 DIAGNOSIS — Z7984 Long term (current) use of oral hypoglycemic drugs: Secondary | ICD-10-CM | POA: Diagnosis not present

## 2023-04-07 DIAGNOSIS — Z008 Encounter for other general examination: Secondary | ICD-10-CM | POA: Diagnosis not present

## 2023-04-07 DIAGNOSIS — N952 Postmenopausal atrophic vaginitis: Secondary | ICD-10-CM | POA: Diagnosis not present

## 2023-04-07 DIAGNOSIS — M8580 Other specified disorders of bone density and structure, unspecified site: Secondary | ICD-10-CM | POA: Diagnosis not present

## 2023-04-07 DIAGNOSIS — E1122 Type 2 diabetes mellitus with diabetic chronic kidney disease: Secondary | ICD-10-CM | POA: Diagnosis not present

## 2023-04-07 DIAGNOSIS — Z882 Allergy status to sulfonamides status: Secondary | ICD-10-CM | POA: Diagnosis not present

## 2023-04-07 DIAGNOSIS — G4733 Obstructive sleep apnea (adult) (pediatric): Secondary | ICD-10-CM | POA: Diagnosis not present

## 2023-04-07 DIAGNOSIS — N189 Chronic kidney disease, unspecified: Secondary | ICD-10-CM | POA: Diagnosis not present

## 2023-04-07 DIAGNOSIS — E039 Hypothyroidism, unspecified: Secondary | ICD-10-CM | POA: Diagnosis not present

## 2023-04-07 DIAGNOSIS — E11620 Type 2 diabetes mellitus with diabetic dermatitis: Secondary | ICD-10-CM | POA: Diagnosis not present

## 2023-04-07 DIAGNOSIS — Z87891 Personal history of nicotine dependence: Secondary | ICD-10-CM | POA: Diagnosis not present

## 2023-04-07 DIAGNOSIS — E785 Hyperlipidemia, unspecified: Secondary | ICD-10-CM | POA: Diagnosis not present

## 2023-04-07 DIAGNOSIS — R32 Unspecified urinary incontinence: Secondary | ICD-10-CM | POA: Diagnosis not present

## 2023-04-10 ENCOUNTER — Other Ambulatory Visit: Payer: Self-pay | Admitting: Gastroenterology

## 2023-04-10 DIAGNOSIS — R634 Abnormal weight loss: Secondary | ICD-10-CM | POA: Diagnosis not present

## 2023-04-10 DIAGNOSIS — K529 Noninfective gastroenteritis and colitis, unspecified: Secondary | ICD-10-CM | POA: Diagnosis not present

## 2023-04-16 DIAGNOSIS — K529 Noninfective gastroenteritis and colitis, unspecified: Secondary | ICD-10-CM | POA: Diagnosis not present

## 2023-04-22 DIAGNOSIS — Z1331 Encounter for screening for depression: Secondary | ICD-10-CM | POA: Diagnosis not present

## 2023-04-22 DIAGNOSIS — E785 Hyperlipidemia, unspecified: Secondary | ICD-10-CM | POA: Diagnosis not present

## 2023-04-22 DIAGNOSIS — Z0000 Encounter for general adult medical examination without abnormal findings: Secondary | ICD-10-CM | POA: Diagnosis not present

## 2023-04-22 DIAGNOSIS — E1122 Type 2 diabetes mellitus with diabetic chronic kidney disease: Secondary | ICD-10-CM | POA: Diagnosis not present

## 2023-04-22 DIAGNOSIS — G4733 Obstructive sleep apnea (adult) (pediatric): Secondary | ICD-10-CM | POA: Diagnosis not present

## 2023-04-22 DIAGNOSIS — R946 Abnormal results of thyroid function studies: Secondary | ICD-10-CM | POA: Diagnosis not present

## 2023-04-22 DIAGNOSIS — N1831 Chronic kidney disease, stage 3a: Secondary | ICD-10-CM | POA: Diagnosis not present

## 2023-04-22 DIAGNOSIS — E039 Hypothyroidism, unspecified: Secondary | ICD-10-CM | POA: Diagnosis not present

## 2023-04-22 DIAGNOSIS — H9190 Unspecified hearing loss, unspecified ear: Secondary | ICD-10-CM | POA: Diagnosis not present

## 2023-04-22 DIAGNOSIS — L989 Disorder of the skin and subcutaneous tissue, unspecified: Secondary | ICD-10-CM | POA: Diagnosis not present

## 2023-04-23 DIAGNOSIS — N1831 Chronic kidney disease, stage 3a: Secondary | ICD-10-CM | POA: Diagnosis not present

## 2023-04-23 DIAGNOSIS — R946 Abnormal results of thyroid function studies: Secondary | ICD-10-CM | POA: Diagnosis not present

## 2023-04-23 DIAGNOSIS — E1169 Type 2 diabetes mellitus with other specified complication: Secondary | ICD-10-CM | POA: Diagnosis not present

## 2023-05-13 ENCOUNTER — Ambulatory Visit
Admission: RE | Admit: 2023-05-13 | Discharge: 2023-05-13 | Disposition: A | Discharge status: Home / Self Care | Payer: Medicare HMO | Source: Ambulatory Visit | Attending: Gastroenterology | Admitting: Gastroenterology

## 2023-05-13 DIAGNOSIS — R197 Diarrhea, unspecified: Secondary | ICD-10-CM | POA: Diagnosis not present

## 2023-05-13 DIAGNOSIS — R634 Abnormal weight loss: Secondary | ICD-10-CM | POA: Diagnosis not present

## 2023-05-13 DIAGNOSIS — K529 Noninfective gastroenteritis and colitis, unspecified: Secondary | ICD-10-CM

## 2023-05-13 MED ORDER — IOPAMIDOL (ISOVUE-300) INJECTION 61%
100.0000 mL | Freq: Once | INTRAVENOUS | Status: AC | PRN
  Administered 2023-05-13: 100 mL via INTRAVENOUS

## 2023-05-19 DIAGNOSIS — W06XXXA Fall from bed, initial encounter: Secondary | ICD-10-CM | POA: Diagnosis not present

## 2023-05-19 DIAGNOSIS — S60219A Contusion of unspecified wrist, initial encounter: Secondary | ICD-10-CM | POA: Diagnosis not present

## 2023-05-19 DIAGNOSIS — S4991XA Unspecified injury of right shoulder and upper arm, initial encounter: Secondary | ICD-10-CM | POA: Diagnosis not present

## 2023-05-26 DIAGNOSIS — L720 Epidermal cyst: Secondary | ICD-10-CM | POA: Diagnosis not present

## 2023-06-18 DIAGNOSIS — G4733 Obstructive sleep apnea (adult) (pediatric): Secondary | ICD-10-CM | POA: Diagnosis not present

## 2023-07-03 DIAGNOSIS — R197 Diarrhea, unspecified: Secondary | ICD-10-CM | POA: Diagnosis not present

## 2023-07-03 DIAGNOSIS — D122 Benign neoplasm of ascending colon: Secondary | ICD-10-CM | POA: Diagnosis not present

## 2023-07-03 DIAGNOSIS — K5730 Diverticulosis of large intestine without perforation or abscess without bleeding: Secondary | ICD-10-CM | POA: Diagnosis not present

## 2023-07-03 DIAGNOSIS — K648 Other hemorrhoids: Secondary | ICD-10-CM | POA: Diagnosis not present

## 2023-07-03 DIAGNOSIS — K6389 Other specified diseases of intestine: Secondary | ICD-10-CM | POA: Diagnosis not present

## 2023-07-18 ENCOUNTER — Ambulatory Visit: Payer: Medicare HMO | Admitting: Emergency Medicine

## 2023-07-18 ENCOUNTER — Encounter: Payer: Self-pay | Admitting: Emergency Medicine

## 2023-07-18 VITALS — BP 108/62 | HR 78 | Ht 62.0 in | Wt 130.6 lb

## 2023-07-18 DIAGNOSIS — R911 Solitary pulmonary nodule: Secondary | ICD-10-CM | POA: Insufficient documentation

## 2023-07-18 NOTE — Patient Instructions (Addendum)
We reviewed his CT scan of the abdomen today. We will repeat a CT scan of the chest in 1 year, June 2025. Continue to use her CPAP reliably every night Follow Dr. Delton Coombes in June 2025 after your CT chest so we can review those results together

## 2023-07-18 NOTE — Progress Notes (Signed)
Subjective:    Patient ID: Renee Stout, female    DOB: 02-27-1941, 82 y.o.   MRN: [id number]  HPI 82 year old former smoker (5 pack years) with a history of hypertension, OSA, type 2 diabetes, hypothyroidism, eosinophilia.  She is referred today for pulmonary nodule that was spuriously found on a CT scan of her abdomen/pelvis on 05/13/2023. No hx CA. Today she reports that the Ct Abd was done to eval diarrhea. She had a CSY w Dr Marca Ancona. She is active and has no SOB.  She uses a CPAP reliably every night  CT abdomen/pelvis 05/13/2023 reviewed by me, shows a 6 mm right lower lobe pulmonary nodule that was spuriously found, unclear significance.   Review of Systems As per HPI  Past Medical History:  Diagnosis Date   Arthritis    Chicken pox    Diverticulitis    Eosinophilia    Essential hypertension, benign    GERD (gastroesophageal reflux disease)    Hyperlipidemia    Obstructive sleep apnea (adult) (pediatric)    Osteoporosis, unspecified    Type II or unspecified type diabetes mellitus with renal manifestations, not stated as uncontrolled(250.40)    Unspecified hypothyroidism      Family History  Problem Relation Age of Onset   Coronary artery disease Other        positive for early CAD   Diabetes Brother    Diabetes Sister     No family hx lung CA.   Social History   Socioeconomic History   Marital status: Widowed    Spouse name: Not on file   Number of children: Not on file   Years of education: Not on file   Highest education level: Not on file  Occupational History   Not on file  Tobacco Use   Smoking status: Former    Current packs/day: 0.00    Types: Cigarettes    Start date: 11/25/1978    Quit date: 11/25/1988    Years since quitting: 34.6   Smokeless tobacco: Not on file  Substance and Sexual Activity   Alcohol use: Yes    Comment: rare    Drug use: No   Sexual activity: Not on file  Other Topics Concern   Not on file  Social History Narrative    No caffeine   No Recreational drug use    Diet: regular   Exercise : yes - 30 min a day on treadmill 5x week    Occupation : retired Education officer, community status : married ,Husband also our patient   Children: girls 1         Social Determinants of Corporate investment banker Strain: Not on file  Food Insecurity: Not on file  Transportation Needs: Not on file  Physical Activity: Not on file  Stress: Not on file  Social Connections: Not on file  Intimate Partner Violence: Not on file     Allergies  Allergen Reactions   Elemental Sulfur     Causes hives     Outpatient Medications Prior to Visit  Medication Sig Dispense Refill   aspirin 81 MG tablet Take 81 mg by mouth daily.     Calcium Carb-Cholecalciferol (CALCIUM 600 + D PO) Take by mouth 2 (two) times daily.     levothyroxine (SYNTHROID, LEVOTHROID) 88 MCG tablet Take 88 mcg by mouth daily before breakfast.     lisinopril (PRINIVIL,ZESTRIL) 10 MG tablet Take 5 mg by mouth at bedtime.  metroNIDAZOLE (FLAGYL) 500 MG tablet Take 500 mg by mouth 3 (three) times daily.     Multiple Vitamins-Minerals (MULTIVITAL) tablet Take 1 tablet by mouth daily.     simvastatin (ZOCOR) 40 MG tablet Take 20 mg by mouth at bedtime.      ciprofloxacin (CIPRO) 500 MG tablet Take 500 mg by mouth 2 (two) times daily.     esomeprazole (NEXIUM) 20 MG capsule Take 20 mg by mouth daily at 12 noon.     tamsulosin (FLOMAX) 0.4 MG CAPS capsule Take 0.4 mg by mouth daily.     No facility-administered medications prior to visit.         Objective:   Physical Exam  Vitals:   07/18/23 1103  BP: 108/62  Pulse: 78  SpO2: 98%  Weight: 130 lb 9.6 oz (59.2 kg)  Height: 5\' 2"  (1.575 m)   Gen: Pleasant, well-nourished, in no distress,  normal affect  ENT: No lesions,  mouth clear,  oropharynx clear, no postnasal drip  Neck: No JVD, no stridor  Lungs: No use of accessory muscles, no crackles or wheezing on normal respiration, no wheeze  on forced expiration  Cardiovascular: RRR, heart sounds normal, no murmur or gallops, no peripheral edema  Musculoskeletal: No deformities, no cyanosis or clubbing  Neuro: alert, awake, non focal  Skin: Warm, no lesions or rash      Assessment & Plan:   Pulmonary nodule 6 mm right lower lobe pulmonary nodule spuriously found in a low risk patient.  We will plan to repeat a CT chest in 1 year.  If clinically and radiographically stable at that then we will decide if any further follow-up is needed.  Levy Pupa, MD, PhD 07/18/2023, 11:29 AM [facility] [phone] or if no answer before 7:00PM call [phone] For any issues after 7:00PM please call eLink [phone]

## 2023-07-18 NOTE — Assessment & Plan Note (Signed)
6 mm right lower lobe pulmonary nodule spuriously found in a low risk patient.  We will plan to repeat a CT chest in 1 year.  If clinically and radiographically stable at that then we will decide if any further follow-up is needed.

## 2023-07-21 DIAGNOSIS — K52832 Lymphocytic colitis: Secondary | ICD-10-CM | POA: Diagnosis not present

## 2023-07-21 DIAGNOSIS — R634 Abnormal weight loss: Secondary | ICD-10-CM | POA: Diagnosis not present

## 2023-08-04 DIAGNOSIS — E039 Hypothyroidism, unspecified: Secondary | ICD-10-CM | POA: Diagnosis not present

## 2023-08-25 DIAGNOSIS — N201 Calculus of ureter: Secondary | ICD-10-CM | POA: Diagnosis not present

## 2023-08-28 ENCOUNTER — Other Ambulatory Visit: Payer: Self-pay | Admitting: Urology

## 2023-08-28 NOTE — Progress Notes (Addendum)
COVID Vaccine received:  []  No [x]  Yes Date of any COVID positive Test in last 90 days: no PCP - Lorenda Ishihara MD Cardiologist - no  Chest x-ray -  EKG -   Stress Test -  ECHO - no Cardiac Cath - no  Bowel Prep - [x]  No  []   Yes ______  Pacemaker / ICD device [x]  No []  Yes   Spinal Cord Stimulator:[x]  No []  Yes       History of Sleep Apnea? []  No [x]  Yes   CPAP used?- []  No [x]  Yes    Does the patient monitor blood sugar?          [x]  No []  Yes  []  N/A  Patient has: []  NO Hx DM   []  Pre-DM                  DM1   [x]  DM2 Does patient have a [employer] or Dexacom? [x]  No []  Yes   Fasting Blood Sugar Ranges-  Checks Blood Sugar _____ times a day  GLP1 agonist / usual dose - no GLP1 instructions:  SGLT-2 inhibitors / usual dose - no SGLT-2 instructions:   Blood Thinner / Instructions:no Aspirin Instructions:no  Comments:   Activity level: Patient is able to climb a flight of stairs without difficulty; [x]  No CP  [x]  No SOB,    Patient can perform ADLs without assistance.   Anesthesia review: DM, HTN,PLease review EKG.  Patient denies shortness of breath, fever, cough and chest pain at PAT appointment.  Patient verbalized understanding and agreement to the Pre-Surgical Instructions that were given to them at this PAT appointment. Patient was also educated of the need to review these PAT instructions again prior to his/her surgery.I reviewed the appropriate phone numbers to call if they have any and questions or concerns.

## 2023-08-28 NOTE — Progress Notes (Signed)
Please send pre op orders for PST appointment 08/29/23.

## 2023-08-28 NOTE — Patient Instructions (Addendum)
SURGICAL WAITING ROOM VISITATION  Patients having surgery or a procedure may have no more than 2 support people in the waiting area - these visitors may rotate.    Children under the age of 24 must have an adult with them who is not the patient.  Due to an increase in RSV and influenza rates and associated hospitalizations, children ages 38 and under may not visit patients in [facility] hospitals.  If the patient needs to stay at the hospital during part of their recovery, the visitor guidelines for inpatient rooms apply. Pre-op nurse will coordinate an appropriate time for 1 support person to accompany patient in pre-op.  This support person may not rotate.    Please refer to the [facility] website for the visitor guidelines for Inpatients (after your surgery is over and you are in a regular room).       Your procedure is scheduled on: 09/03/23   Report to [facility] Main Entrance    Report to admitting at  8 AM   Call this number if you have problems the morning of surgery [phone]   Do not eat food or drink liquids:After Midnight.      Oral Hygiene is also important to reduce your risk of infection.                                    Remember - BRUSH YOUR TEETH THE MORNING OF SURGERY WITH YOUR REGULAR TOOTHPASTE   Stop all vitamins and herbal supplements 7 days before surgery.   Take these medicines the morning of surgery with A SIP OF WATER:  Entocort, synthroid, simvastatin  DO NOT TAKE ANY ORAL DIABETIC MEDICATIONS DAY OF YOUR SURGERY- Hold Metformin the day of surgery             You may not have any metal on your body including hair pins, jewelry, and body piercing             Do not wear make-up, lotions, powders, perfumes or deodorant  Do not wear nail polish including gel and S&S, artificial/acrylic nails, or any other type of covering on natural nails including finger and toenails. If you have artificial nails, gel coating, etc. that needs to be  removed by a nail salon please have this removed prior to surgery or surgery may need to be canceled/ delayed if the surgeon/ anesthesia feels like they are unable to be safely monitored.   Bring CPAP mask and tubing to surgery  Do not shave  48 hours prior to surgery.    Do not bring valuables to the hospital. [facility] IS NOT             RESPONSIBLE   FOR VALUABLES.   Contacts, glasses, dentures or bridgework may not be worn into surgery.  DO NOT BRING YOUR HOME MEDICATIONS TO THE HOSPITAL. PHARMACY WILL DISPENSE MEDICATIONS LISTED ON YOUR MEDICATION LIST TO YOU DURING YOUR ADMISSION IN THE HOSPITAL!    Patients discharged on the day of surgery will not be allowed to drive home.  Someone NEEDS to stay with you for the first 24 hours after anesthesia.   Special Instructions: Bring a copy of your healthcare power of attorney and living will documents the day of surgery if you haven't scanned them before.              Please read over the following fact  sheets you were given: IF YOU HAVE QUESTIONS ABOUT YOUR PRE-OP INSTRUCTIONS PLEASE CALL [phone] [place] [place]   If you received a COVID test during your pre-op visit  it is requested that you wear a mask when out in public, stay away from anyone that may not be feeling well and notify your surgeon if you develop symptoms. If you test positive for Covid or have been in contact with anyone that has tested positive in the last 10 days please notify you surgeon.    [facility] - Preparing for Surgery Before surgery, you can play an important role.  Because skin is not sterile, your skin needs to be as free of germs as possible.  You can reduce the number of germs on your skin by washing with CHG (chlorahexidine gluconate) soap before surgery.  CHG is an antiseptic cleaner which kills germs and bonds with the skin to continue killing germs even after washing. Please DO NOT use if you have an allergy to CHG or antibacterial soaps.  If your skin  becomes reddened/irritated stop using the CHG and inform your nurse when you arrive at Short Stay. Do not shave (including legs and underarms) for at least 48 hours prior to the first CHG shower.  You may shave your face/neck.  Please follow these instructions carefully:  1.  Shower with CHG Soap the night before surgery and the  morning of surgery.  2.  If you choose to wash your hair, wash your hair first as usual with your normal  shampoo.  3.  After you shampoo, rinse your hair and body thoroughly to remove the shampoo.                             4.  Use CHG as you would any other liquid soap.  You can apply chg directly to the skin and wash.  Gently with a scrungie or clean washcloth.  5.  Apply the CHG Soap to your body ONLY FROM THE NECK DOWN.   Do   not use on face/ open                           Wound or open sores. Avoid contact with eyes, ears mouth and   genitals (private parts).                       Wash face,  Genitals (private parts) with your normal soap.             6.  Wash thoroughly, paying special attention to the area where your    surgery  will be performed.  7.  Thoroughly rinse your body with warm water from the neck down.  8.  DO NOT shower/wash with your normal soap after using and rinsing off the CHG Soap.                9.  Pat yourself dry with a clean towel.            10.  Wear clean pajamas.            11.  Place clean sheets on your bed the night of your first shower and do not  sleep with pets. Day of Surgery : Do not apply any lotions/deodorants the morning of surgery.  Please wear clean clothes to the hospital/surgery center.  FAILURE TO FOLLOW THESE INSTRUCTIONS  MAY RESULT IN THE CANCELLATION OF YOUR SURGERY  PATIENT SIGNATURE_________________________________  NURSE SIGNATURE__________________________________  ________________________________________________________________________

## 2023-08-29 ENCOUNTER — Encounter (HOSPITAL_COMMUNITY)
Admission: RE | Admit: 2023-08-29 | Discharge: 2023-08-29 | Disposition: A | Discharge status: Home / Self Care | Payer: Medicare HMO | Source: Ambulatory Visit | Attending: Urology | Admitting: Urology

## 2023-08-29 ENCOUNTER — Other Ambulatory Visit: Payer: Self-pay

## 2023-08-29 ENCOUNTER — Encounter (HOSPITAL_COMMUNITY): Payer: Self-pay

## 2023-08-29 VITALS — BP 118/61 | HR 90 | Temp 98.4°F | Resp 16 | Ht 62.0 in | Wt 128.0 lb

## 2023-08-29 DIAGNOSIS — I10 Essential (primary) hypertension: Secondary | ICD-10-CM | POA: Diagnosis not present

## 2023-08-29 DIAGNOSIS — Z01810 Encounter for preprocedural cardiovascular examination: Secondary | ICD-10-CM | POA: Diagnosis not present

## 2023-08-29 DIAGNOSIS — R9431 Abnormal electrocardiogram [ECG] [EKG]: Secondary | ICD-10-CM | POA: Diagnosis not present

## 2023-08-29 DIAGNOSIS — E119 Type 2 diabetes mellitus without complications: Secondary | ICD-10-CM | POA: Diagnosis not present

## 2023-08-29 DIAGNOSIS — Z01812 Encounter for preprocedural laboratory examination: Secondary | ICD-10-CM | POA: Insufficient documentation

## 2023-08-29 DIAGNOSIS — N201 Calculus of ureter: Secondary | ICD-10-CM | POA: Diagnosis not present

## 2023-08-29 DIAGNOSIS — Z01818 Encounter for other preprocedural examination: Secondary | ICD-10-CM | POA: Diagnosis not present

## 2023-08-29 HISTORY — DX: Type 2 diabetes mellitus without complications: E11.9

## 2023-08-29 HISTORY — DX: Personal history of urinary calculi: Z87.442

## 2023-08-29 LAB — HEMOGLOBIN A1C
Hgb A1c MFr Bld: 6.4 % — ABNORMAL HIGH (ref 4.8–5.6)
Mean Plasma Glucose: 136.98 mg/dL

## 2023-08-29 LAB — CBC
HCT: 39.2 % (ref 36.0–46.0)
Hemoglobin: 12.4 g/dL (ref 12.0–15.0)
MCH: 30.5 pg (ref 26.0–34.0)
MCHC: 31.6 g/dL (ref 30.0–36.0)
MCV: 96.3 fL (ref 80.0–100.0)
Platelets: 252 10*3/uL (ref 150–400)
RBC: 4.07 MIL/uL (ref 3.87–5.11)
RDW: 13.3 % (ref 11.5–15.5)
WBC: 7.7 10*3/uL (ref 4.0–10.5)
nRBC: 0 % (ref 0.0–0.2)

## 2023-08-29 LAB — BASIC METABOLIC PANEL
Anion gap: 10 (ref 5–15)
BUN: 24 mg/dL — ABNORMAL HIGH (ref 8–23)
CO2: 25 mmol/L (ref 22–32)
Calcium: 9.8 mg/dL (ref 8.9–10.3)
Chloride: 105 mmol/L (ref 98–111)
Creatinine, Ser: 1.09 mg/dL — ABNORMAL HIGH (ref 0.44–1.00)
GFR, Estimated: 51 mL/min — ABNORMAL LOW (ref >60)
Glucose, Bld: 128 mg/dL — ABNORMAL HIGH (ref 70–99)
Potassium: 3.9 mmol/L (ref 3.5–5.1)
Sodium: 140 mmol/L (ref 135–145)

## 2023-09-03 ENCOUNTER — Encounter (HOSPITAL_COMMUNITY): Payer: Self-pay | Admitting: Urology

## 2023-09-03 ENCOUNTER — Ambulatory Visit (HOSPITAL_COMMUNITY)
Admission: RE | Admit: 2023-09-03 | Discharge: 2023-09-03 | Disposition: A | Discharge status: Home / Self Care | Payer: Medicare HMO | Source: Ambulatory Visit | Attending: Urology | Admitting: Urology

## 2023-09-03 ENCOUNTER — Ambulatory Visit (HOSPITAL_COMMUNITY): Payer: Self-pay | Admitting: Physician Assistant

## 2023-09-03 ENCOUNTER — Ambulatory Visit (HOSPITAL_BASED_OUTPATIENT_CLINIC_OR_DEPARTMENT_OTHER): Payer: Medicare HMO | Admitting: Certified Registered Nurse Anesthetist

## 2023-09-03 ENCOUNTER — Ambulatory Visit (HOSPITAL_COMMUNITY): Payer: Medicare HMO

## 2023-09-03 ENCOUNTER — Encounter (HOSPITAL_COMMUNITY)
Admission: RE | Disposition: A | Discharge status: Home / Self Care | Payer: Self-pay | Source: Ambulatory Visit | Attending: Urology

## 2023-09-03 DIAGNOSIS — I10 Essential (primary) hypertension: Secondary | ICD-10-CM

## 2023-09-03 DIAGNOSIS — E119 Type 2 diabetes mellitus without complications: Secondary | ICD-10-CM

## 2023-09-03 DIAGNOSIS — N132 Hydronephrosis with renal and ureteral calculous obstruction: Secondary | ICD-10-CM | POA: Insufficient documentation

## 2023-09-03 DIAGNOSIS — N200 Calculus of kidney: Secondary | ICD-10-CM

## 2023-09-03 DIAGNOSIS — Z87891 Personal history of nicotine dependence: Secondary | ICD-10-CM | POA: Diagnosis not present

## 2023-09-03 DIAGNOSIS — E039 Hypothyroidism, unspecified: Secondary | ICD-10-CM | POA: Diagnosis not present

## 2023-09-03 DIAGNOSIS — G4730 Sleep apnea, unspecified: Secondary | ICD-10-CM | POA: Insufficient documentation

## 2023-09-03 DIAGNOSIS — N201 Calculus of ureter: Secondary | ICD-10-CM | POA: Diagnosis not present

## 2023-09-03 DIAGNOSIS — Z7984 Long term (current) use of oral hypoglycemic drugs: Secondary | ICD-10-CM | POA: Diagnosis not present

## 2023-09-03 HISTORY — PX: CYSTOSCOPY/URETEROSCOPY/HOLMIUM LASER/STENT PLACEMENT: SHX6546

## 2023-09-03 LAB — GLUCOSE, CAPILLARY
Glucose-Capillary: 99 mg/dL (ref 70–99)
Glucose-Capillary: 152 mg/dL — ABNORMAL HIGH (ref 70–99)
Glucose-Capillary: 118 mg/dL — ABNORMAL HIGH (ref 70–99)

## 2023-09-03 SURGERY — CYSTOSCOPY/URETEROSCOPY/HOLMIUM LASER/STENT PLACEMENT
Anesthesia: Monitor Anesthesia Care | Laterality: Left

## 2023-09-03 MED ORDER — PHENYLEPHRINE HCL (PRESSORS) 10 MG/ML IV SOLN
INTRAVENOUS | Status: DC | PRN
  Administered 2023-09-03: 80 ug via INTRAVENOUS

## 2023-09-03 MED ORDER — PHENYLEPHRINE 80 MCG/ML (10ML) SYRINGE FOR IV PUSH (FOR BLOOD PRESSURE SUPPORT)
PREFILLED_SYRINGE | INTRAVENOUS | Status: AC
  Filled 2023-09-03: qty 10

## 2023-09-03 MED ORDER — SODIUM CHLORIDE 0.9 % IR SOLN
Status: DC | PRN
  Administered 2023-09-03: 3000 mL via INTRAVESICAL

## 2023-09-03 MED ORDER — CEFAZOLIN SODIUM-DEXTROSE 2-4 GM/100ML-% IV SOLN
2.0000 g | INTRAVENOUS | Status: AC
  Administered 2023-09-03: 2 g via INTRAVENOUS
  Filled 2023-09-03: qty 100

## 2023-09-03 MED ORDER — TRAMADOL HCL 50 MG PO TABS
ORAL_TABLET | ORAL | Status: AC
  Filled 2023-09-03: qty 1

## 2023-09-03 MED ORDER — PROPOFOL 10 MG/ML IV BOLUS
INTRAVENOUS | Status: DC | PRN
  Administered 2023-09-03: 100 mg via INTRAVENOUS
  Administered 2023-09-03: 30 mg via INTRAVENOUS
  Administered 2023-09-03: 20 mg via INTRAVENOUS

## 2023-09-03 MED ORDER — DEXAMETHASONE SODIUM PHOSPHATE 4 MG/ML IJ SOLN
INTRAMUSCULAR | Status: DC | PRN
  Administered 2023-09-03: 4 mg via INTRAVENOUS

## 2023-09-03 MED ORDER — FENTANYL CITRATE (PF) 100 MCG/2ML IJ SOLN
INTRAMUSCULAR | Status: AC
  Filled 2023-09-03: qty 2

## 2023-09-03 MED ORDER — DEXAMETHASONE SODIUM PHOSPHATE 10 MG/ML IJ SOLN
INTRAMUSCULAR | Status: AC
  Filled 2023-09-03: qty 1

## 2023-09-03 MED ORDER — LACTATED RINGERS IV SOLN
INTRAVENOUS | Status: DC | PRN
Start: 2023-09-03 — End: 2023-09-03

## 2023-09-03 MED ORDER — ONDANSETRON HCL 4 MG/2ML IJ SOLN
INTRAMUSCULAR | Status: AC
  Filled 2023-09-03: qty 2

## 2023-09-03 MED ORDER — LIDOCAINE HCL (CARDIAC) PF 100 MG/5ML IV SOSY
PREFILLED_SYRINGE | INTRAVENOUS | Status: DC | PRN
  Administered 2023-09-03: 60 mg via INTRAVENOUS

## 2023-09-03 MED ORDER — TRAMADOL HCL 50 MG PO TABS: 50.0000 mg | ORAL_TABLET | Freq: Four times a day (QID) | ORAL | 0 refills | Status: AC | PRN

## 2023-09-03 MED ORDER — PHENAZOPYRIDINE HCL 200 MG PO TABS: 200.0000 mg | ORAL_TABLET | Freq: Three times a day (TID) | ORAL | 0 refills | Status: AC | PRN

## 2023-09-03 MED ORDER — PROPOFOL 10 MG/ML IV BOLUS
INTRAVENOUS | Status: AC
  Filled 2023-09-03: qty 20

## 2023-09-03 MED ORDER — ONDANSETRON HCL 4 MG/2ML IJ SOLN
INTRAMUSCULAR | Status: DC | PRN
  Administered 2023-09-03: 4 mg via INTRAVENOUS

## 2023-09-03 MED ORDER — INSULIN ASPART 100 UNIT/ML IJ SOLN: 0.0000 [IU] | INTRAMUSCULAR | Status: DC | PRN

## 2023-09-03 MED ORDER — FENTANYL CITRATE (PF) 100 MCG/2ML IJ SOLN
INTRAMUSCULAR | Status: DC | PRN
  Administered 2023-09-03 (×2): 25 ug via INTRAVENOUS

## 2023-09-03 MED ORDER — IOHEXOL 300 MG/ML  SOLN
INTRAMUSCULAR | Status: DC | PRN
  Administered 2023-09-03: 19 mL via URETHRAL

## 2023-09-03 MED ORDER — LACTATED RINGERS IV SOLN: INTRAVENOUS | Status: DC

## 2023-09-03 MED ORDER — TRAMADOL HCL 50 MG PO TABS
50.0000 mg | ORAL_TABLET | Freq: Once | ORAL | Status: AC
  Administered 2023-09-03: 50 mg via ORAL

## 2023-09-03 MED ORDER — CHLORHEXIDINE GLUCONATE 0.12 % MT SOLN
15.0000 mL | Freq: Once | OROMUCOSAL | Status: AC
  Administered 2023-09-03: 15 mL via OROMUCOSAL

## 2023-09-03 MED ORDER — ORAL CARE MOUTH RINSE: 15.0000 mL | Freq: Once | OROMUCOSAL | Status: AC

## 2023-09-03 MED ORDER — LIDOCAINE HCL (PF) 2 % IJ SOLN
INTRAMUSCULAR | Status: AC
  Filled 2023-09-03: qty 5

## 2023-09-03 SURGICAL SUPPLY — 23 items
BAG URO CATCHER STRL LF (MISCELLANEOUS) ×2 IMPLANT
BASKET ZERO TIP NITINOL 2.4FR (BASKET) IMPLANT
BSKT STON RTRVL ZERO TP 2.4FR (BASKET)
CATH URETL OPEN 5X70 (CATHETERS) ×2 IMPLANT
CLOTH BEACON ORANGE TIMEOUT ST (SAFETY) ×2 IMPLANT
EXTRACTOR STONE 1.7FRX115CM (UROLOGICAL SUPPLIES) IMPLANT
GLOVE SURG LX STRL 7.5 STRW (GLOVE) ×2 IMPLANT
GOWN STRL REUS W/ TWL XL LVL3 (GOWN DISPOSABLE) ×2 IMPLANT
GOWN STRL REUS W/TWL XL LVL3 (GOWN DISPOSABLE) ×2
GUIDEWIRE ANG ZIPWIRE 038X150 (WIRE) IMPLANT
GUIDEWIRE STR DUAL SENSOR (WIRE) ×2 IMPLANT
KIT TURNOVER KIT A (KITS) IMPLANT
LASER FIB FLEXIVA PULSE ID 365 (Laser) IMPLANT
MANIFOLD NEPTUNE II (INSTRUMENTS) ×2 IMPLANT
PACK CYSTO (CUSTOM PROCEDURE TRAY) ×2 IMPLANT
PAD PREP 24X48 CUFFED NSTRL (MISCELLANEOUS) ×2 IMPLANT
SHEATH NAVIGATOR HD 12/14X28 (SHEATH) IMPLANT
SHEATH NAVIGATOR HD 12/14X36 (SHEATH) IMPLANT
STENT URET 6FRX24 CONTOUR (STENTS) IMPLANT
TRACTIP FLEXIVA PULS ID 200XHI (Laser) IMPLANT
TRACTIP FLEXIVA PULSE ID 200 (Laser)
TUBING CONNECTING 10 (TUBING) ×2 IMPLANT
TUBING UROLOGY SET (TUBING) ×2 IMPLANT

## 2023-09-03 NOTE — Anesthesia Procedure Notes (Signed)
Procedure Name: LMA Insertion Date/Time: 09/03/2023 10:37 AM  Performed by: Ludwig Lean, CRNAPre-anesthesia Checklist: Patient identified, Emergency Drugs available, Suction available and Patient being monitored Patient Re-evaluated:Patient Re-evaluated prior to induction Oxygen Delivery Method: Circle system utilized Preoxygenation: Pre-oxygenation with 100% oxygen Induction Type: IV induction Ventilation: Mask ventilation without difficulty LMA: LMA inserted LMA Size: 4.0 Number of attempts: 1 Placement Confirmation: positive ETCO2 and breath sounds checked- equal and bilateral Tube secured with: Tape Dental Injury: Teeth and Oropharynx as per pre-operative assessment

## 2023-09-03 NOTE — Anesthesia Preprocedure Evaluation (Addendum)
Anesthesia Evaluation  Patient identified by MRN, date of birth, ID band Patient awake    Reviewed: Allergy & Precautions, H&P , NPO status , Patient's Chart, lab work & pertinent test results  Airway Mallampati: II  TM Distance: >3 FB Neck ROM: Full    Dental no notable dental hx.    Pulmonary sleep apnea , former smoker   Pulmonary exam normal breath sounds clear to auscultation       Cardiovascular hypertension, Normal cardiovascular exam Rhythm:Regular Rate:Normal     Neuro/Psych negative neurological ROS  negative psych ROS   GI/Hepatic Neg liver ROS,GERD  ,,  Endo/Other  diabetesHypothyroidism    Renal/GU negative Renal ROS  negative genitourinary   Musculoskeletal  (+) Arthritis ,    Abdominal   Peds negative pediatric ROS (+)  Hematology negative hematology ROS (+)   Anesthesia Other Findings   Reproductive/Obstetrics negative OB ROS                             Anesthesia Physical Anesthesia Plan  ASA: 3  Anesthesia Plan: MAC   Post-op Pain Management:    Induction: Intravenous  PONV Risk Score and Plan: Treatment may vary due to age or medical condition, Ondansetron and Dexamethasone  Airway Management Planned: LMA  Additional Equipment:   Intra-op Plan:   Post-operative Plan:   Informed Consent: I have reviewed the patients History and Physical, chart, labs and discussed the procedure including the risks, benefits and alternatives for the proposed anesthesia with the patient or authorized representative who has indicated his/her understanding and acceptance.     Dental advisory given  Plan Discussed with: CRNA  Anesthesia Plan Comments:        Anesthesia Quick Evaluation

## 2023-09-03 NOTE — Transfer of Care (Signed)
Immediate Anesthesia Transfer of Care Note  Patient: Renee Stout  Procedure(s) Performed: Procedure(s) with comments: CYSTOSCOPY, LEFT URETEROSCOPY, HOLMIUM LASER LITHOTRIPSY, AND LEFT URETERAL STENT PLACEMENT, BASKET STONE REMOVAL (Left) - 60 MINUTES  Patient Location: PACU  Anesthesia Type:General  Level of Consciousness: Patient easily awoken, comfortable, cooperative, following commands, responds to stimulation.   Airway & Oxygen Therapy: Patient spontaneously breathing, ventilating well, oxygen via simple oxygen mask.  Post-op Assessment: Report given to PACU RN, vital signs reviewed and stable, moving all extremities.   Post vital signs: Reviewed and stable.  Complications: No apparent anesthesia complications Last Vitals:  Vitals Value Taken Time  BP 96/65 09/03/23 1222  Temp    Pulse 64 09/03/23 1224  Resp 15 09/03/23 1224  SpO2 100 % 09/03/23 1224  Vitals shown include unfiled device data.  Last Pain:  Vitals:   09/03/23 0840  TempSrc:   PainSc: 0-No pain         Complications: No notable events documented.

## 2023-09-03 NOTE — Interval H&P Note (Signed)
History and Physical Interval Note:  09/03/2023 10:05 AM  Renee Stout  has presented today for surgery, with the diagnosis of LEFT URETERAL CALCULUS.  The various methods of treatment have been discussed with the patient and family. After consideration of risks, benefits and other options for treatment, the patient has consented to  Procedure(s) with comments: CYSTOSCOPY, LEFT URETEROSCOPY, HOLMIUM LASER LITHOTRIPSY, AND LEFT URETERAL STENT PLACEMENT (Left) - 60 MINUTES as a surgical intervention.  The patient's history has been reviewed, patient examined, no change in status, stable for surgery.  I have reviewed the patient's chart and labs.  Questions were answered to the patient's satisfaction.     Crist Fat

## 2023-09-03 NOTE — Anesthesia Postprocedure Evaluation (Signed)
Anesthesia Post Note  Patient: Renee Stout  Procedure(s) Performed: CYSTOSCOPY, LEFT URETEROSCOPY, HOLMIUM LASER LITHOTRIPSY, AND LEFT URETERAL STENT PLACEMENT, BASKET STONE REMOVAL (Left)     Patient location during evaluation: PACU Anesthesia Type: MAC Level of consciousness: awake and alert Pain management: pain level controlled Vital Signs Assessment: post-procedure vital signs reviewed and stable Respiratory status: spontaneous breathing, nonlabored ventilation, respiratory function stable and patient connected to nasal cannula oxygen Cardiovascular status: blood pressure returned to baseline and stable Postop Assessment: no apparent nausea or vomiting Anesthetic complications: no   No notable events documented.  Last Vitals:  Vitals:   09/03/23 1235 09/03/23 1245  BP: (!) 98/56 (!) 102/43  Pulse: 67 70  Resp: 15 12  Temp:    SpO2: 98% 95%    Last Pain:  Vitals:   09/03/23 1245  TempSrc:   PainSc: 0-No pain                 [place]

## 2023-09-03 NOTE — H&P (Signed)
Left-sided ureteral stone   82 year old female presents today for evaluation of left obstructive ureteral stone and associated hydronephrosis. This was an incidental finding in June. The patient underwent a CT scan for diarrhea. She was subsequently told that she had a stone in the left ureter and associated dilation or blockage. She is here today for further evaluation. She continues to deny any pain or renal colic. She denies any hematuria or dysuria. She denies any fevers or chills. She is otherwise been doing well. She has no back pain.   The patient underwent shockwave lithotripsy in 2016 for right mid ureteral stone. She has not been seen since 2017.     ALLERGIES: Sulfa Drugs    MEDICATIONS: Levothyroxine Sodium 175 mcg capsule  Metformin Hcl  Calcium Citrate- Vitamin D 315 mg calcium-6.25 mcg (250 unit) tablet Oral  Daily Multiple Vitamin tablet Oral  Estradiol (Once Weekly) 0.1 mg/24 hour patch, transdermal weekly  Lisinopril 5 MG Oral Tablet 0 Oral  Simvastatin TABS 0 Oral  Tylenol     GU PSH: ESWL - 2016       PSH Notes: Lithotripsy, Treatment Of Forearm Fracture   NON-GU PSH: Anesth, Lower Arm Surgery Wrist Arthroscopy/surgery     GU PMH: Flank Pain, Right, She has musculoskeletal pain that is made worse with palpation and movement. - 2017 Acute Cystitis/UTI, Acute cystitis without hematuria - 2016 Renal calculus, Nephrolithiasis - 2016 Ureteral calculus, Right ureteral calculus - 2016 Overactive bladder, Overactive bladder - 2014      PMH Notes:  2013-09-18 05:48:48 - Note: Cholelithiasis  2011-10-01 10:53:41 - Note: Thyroid Disorder  2011-10-01 11:31:08 - Note: Vaginal Mucosa Atrophy  2011-10-01 10:47:34 - Note: Arthritis   NON-GU PMH: Encounter for general adult medical examination without abnormal findings, Encounter for preventive health examination - 2016 Age-related osteoporosis without current pathological fracture, Osteoporosis -  2014 Hyperthyroidism, Hyperthyroidism - 2014 Personal history of other diseases of the nervous system and sense organs, History of sleep apnea - 2014 Personal history of other endocrine, nutritional and metabolic disease, History of diabetes mellitus - 2014, History of hyperlipidemia, - 2014 Diabetes Type 2 Sleep Apnea    FAMILY HISTORY: 1 Daughter - Daughter Alzheimer's Disease - Mother Cholelithiasis - Brother Congestive Heart Failure - Father Family Health Status Number - Runs In Family Father Deceased At Age34 ___ - Runs In Family Mother Deceased At Age 35 from diabetic complicati - Runs In Family   SOCIAL HISTORY: Marital Status: Widowed Preferred Language: English; Ethnicity: Not Hispanic Or Latino; Race: White Current Smoking Status: Patient does not smoke anymore.  Does not drink anymore.  Does not use drugs. Does not drink caffeine.     Notes: Widowed, Former smoker, Marital History - Currently Married, Occupation:, Caffeine Use, Alcohol Use   REVIEW OF SYSTEMS:    GU Review Female:   Patient reports leakage of urine. Patient denies frequent urination, hard to postpone urination, burning /pain with urination, get up at night to urinate, stream starts and stops, trouble starting your stream, have to strain to urinate, and being pregnant.  Gastrointestinal (Upper):   Patient denies nausea, vomiting, and indigestion/ heartburn.  Gastrointestinal (Lower):   Patient reports diarrhea. Patient denies constipation.  Constitutional:   Patient denies fever, night sweats, weight loss, and fatigue.  Skin:   Patient denies skin rash/ lesion and itching.  Eyes:   Patient denies blurred vision and double vision.  Ears/ Nose/ Throat:   Patient denies sore throat and sinus problems.  Hematologic/Lymphatic:  Patient denies swollen glands and easy bruising.  Cardiovascular:   Patient denies leg swelling and chest pains.  Respiratory:   Patient denies shortness of breath and cough.   Endocrine:   Patient denies excessive thirst.  Musculoskeletal:   Patient denies back pain and joint pain.  Neurological:   Patient denies headaches and dizziness.  Psychologic:   Patient denies depression and anxiety.   VITAL SIGNS:      08/25/2023 02:40 PM  Weight 128.0 lb / 58.06 kg  Height 158 in / 401.32 cm  BP 92/60 mmHg  Pulse 98 /min  Temperature 97.1 F / 36.1 C  BMI 3.6 kg/m   MULTI-SYSTEM PHYSICAL EXAMINATION:    Constitutional: Well-nourished. No physical deformities. Normally developed. Good grooming.  Neck: Neck symmetrical, not swollen. Normal tracheal position.  Respiratory: Normal breath sounds. No labored breathing, no use of accessory muscles.   Cardiovascular: Regular rate and rhythm. No murmur, no gallop. Normal temperature, normal extremity pulses, no swelling, no varicosities.   Lymphatic: No enlargement of neck, axillae, groin.  Skin: No paleness, no jaundice, no cyanosis. No lesion, no ulcer, no rash.  Neurologic / Psychiatric: Oriented to time, oriented to place, oriented to person. No depression, no anxiety, no agitation.  Gastrointestinal: No mass, no tenderness, no rigidity, non obese abdomen.  Eyes: Normal conjunctivae. Normal eyelids.  Ears, Nose, Mouth, and Throat: Left ear no scars, no lesions, no masses. Right ear no scars, no lesions, no masses. Nose no scars, no lesions, no masses. Normal hearing. Normal lips.  Musculoskeletal: Normal gait and station of head and neck.     Complexity of Data:  Source Of History:  Patient  Records Review:   Previous Doctor Records, Previous Patient Records  Urine Test Review:   Urinalysis  Urodynamics Review:   Review Bladder Scan  X-Ray Review: C.T. Abdomen/Pelvis: Reviewed Films. Discussed With Patient.     PROCEDURES: None   ASSESSMENT:      ICD-10 Details  1 GU:   Ureteral calculus - N20.1    PLAN:           Schedule Return Visit/Planned Activity: ASAP - Schedule Surgery           Document Letter(s):  Created for Patient: Clinical Summary         Notes:   The patient has a left proximal ureteral stone with associated proximal hydroureteronephrosis. The stone is a approximately 6 mm in size. It has been there since at least June, and based on her CT scan findings she has some renal atrophy. I suspect that this has been there longer than that. I am concerned that she is losing renal function and developing atrophy on that side. As such, I recommended that we proceed with treatment. I do not think shockwave lithotripsy is a good option for her at this point because I am concerned that the stone would be impacted. As such, I recommended ureteroscopy. We discussed the procedure in detail. We also discussed the postoperative stent. Given that I am concerned that she has not impacted stone, I would leave the stent for a little bit longer than normal. I would expect that she have the stent in for approximately 10 days and then we pulled out here in clinic.   Fortunately the patient's not symptomatic, but I do think that this is something we should do sooner than later. I went through that with her. I discussed the rationale for. We discussed alternatives. Will try to get her scheduled  ASAP.   Prior to the patient's surgery, she will need a urine analysis as well as a KUB.

## 2023-09-03 NOTE — Op Note (Signed)
Preoperative diagnosis:  Left obstructing ureteral stone  Postoperative diagnosis:  Same  Procedure: Cystoscopy, left ureteroscopy, laser lithotripsy stone removal and stent placement Left retrograde pyelogram with interpretation  Surgeon: Crist Fat, MD  Anesthesia: General  Complications: None  Intraoperative findings:  1: The patient's left retrograde pyelogram demonstrated normal distal ureter.  However, there was no contrast passage beyond the obstructing stone which was in the mid ureter at the level of L4.  Ultimately I was able to get beyond the obstruction in the proximal ureter and collecting system was very torturous and dilated with blunted calyces. #2: The stone was completely impacted and impassable.  There was tissue that had grown over the stone completely occluding the ureter distal. #3: I was able to laser open the ureter but there was significant ureteral trauma in this area.  Some of the stone pieces extravasated outside the ureter.  However, the lumen was patent at the end of the procedure.  EBL: Minimal  Specimens: Stone fragments were sent for stone composition analysis  Indication: TORRA PALA is a 82 y.o. patient with several month history of obstructing left distal ureteral stone, but otherwise asymptomatic.  After reviewing the management options for treatment, he elected to proceed with the above surgical procedure(s). We have discussed the potential benefits and risks of the procedure, side effects of the proposed treatment, the likelihood of the patient achieving the goals of the procedure, and any potential problems that might occur during the procedure or recuperation. Informed consent has been obtained.  Description of procedure: Consent was obtained in the preoperative holding area.  She was brought back to the operating room placed on table supine position.  Anesthesia was then induced and endotracheal tube was inserted.  She was placed in  dorsolithotomy position and prepped and draped in routine sterile fashion.  Time was subsequently performed.  21 French 30 Cisco gently passed to the patient's region in the bladder under vision guidance.  Cystoscopy demonstrated normal bladder mucosa with orthotopic ureteral orifice ease.  The left ureteral orifice was cannulated with the 5 French ureteral catheter and retrograde pyelogram was performed the above findings.  Subsequent advanced a wire through the open-ended catheter which then curled distal to the stone.  I was unable to get the wire beyond this.  I then advanced a ureteroscope up alongside the wire and was unable to get any further with the ureteroscope.  I then used the needle-nose long ureteroscope headache AKI navigate beyond the stone.  I was able to see the stone on fluoroscopy.  And I put then knows that the scope up to the stone and then with a 365 m laser fiber I went through the ureteral tissue and ultimately was able to encounter the stone.  I then lasered the stone into numerous smaller fragments.  Once I was able to get beyond the stone I was able to push through to the true lumen of the ureter and was able to evaluate the patient's proximal ureter which was dilated but otherwise with normal mucosa.  I advanced a wire through the ureteroscope and then remove the scope over the wire leaving the wire in the renal pelvis.  I then advanced a 5 [place] open-ended ureteral catheter over the wire and into the proximal ureter completed the retrograde pyelogram with the above findings.  I then repassed the wire through the open-ended catheter removing the catheter over the wire.  I then repassed the ureteroscope alongside the wire and then  with an engage basket removed most of the fragments of the stone.  There were some stone fragments that extravasated outside the ureter which I left.  In the mid ureter where the stone was impacted there was significant ureteral trauma.  However, it remained  in continuity.  I backed out the ureteroscope noting this area to be fairly dramatically indurated, but there is a aspect of the ureter were normal.  I was able to easily pass a 6 [place] double-J ureteral stent over the wire and into the renal pelvis under for scopic guidance.  Once it was noted to be well within the renal pelvis I advanced it to the urethral meatus performing the wire entirely.  I then drained the patient's bladder and fluoroscopically noted a nice curl within the patient's bladder as well as in the renal pelvis.  The stent tether was was cut so that there was no stent string at the end of the case.  The patient was subsequently extubated returned to PACU stable condition.  Disposition: The patient be discharged home, will be scheduled for follow-up in 2 weeks with a CT scan prior for evaluation of the ureter and the kidney itself prior to removing the stent.

## 2023-09-03 NOTE — Discharge Instructions (Signed)
DISCHARGE INSTRUCTIONS FOR KIDNEY STONE/URETERAL STENT   MEDICATIONS:  1.  Resume all your other meds from home - except do not take any extra narcotic pain meds that you may have at home.  2. Pyridium is to help with the burning/stinging when you urinate. 3. Tramadol is for moderate/severe pain, otherwise taking upto 1000 mg every 6 hours of plainTylenol will help treat your pain.     ACTIVITY:  1. No strenuous activity x 1week  2. No driving while on narcotic pain medications  3. Drink plenty of water  4. Continue to walk at home - you can still get blood clots when you are at home, so keep active, but don't over do it.  5. May return to work/school tomorrow or when you feel ready   BATHING:  1. You can shower and we recommend daily showers  2. You have a string coming from your urethra: The stent string is attached to your ureteral stent. Do not pull on this.   SIGNS/SYMPTOMS TO CALL:  Please call us if you have a fever greater than 101.5, uncontrolled nausea/vomiting, uncontrolled pain, dizziness, unable to urinate, bloody urine, chest pain, shortness of breath, leg swelling, leg pain, redness around wound, drainage from wound, or any other concerns or questions.   You can reach [place] at [phone].   FOLLOW-UP:  1. We will schedule you for f/u with Dr. Marlou Porch in 2 weeks - plan to obtain CT scan prior to the visit.  Someone will contact you with that appointment.

## 2023-09-04 ENCOUNTER — Encounter (HOSPITAL_COMMUNITY): Payer: Self-pay | Admitting: Urology

## 2023-09-04 LAB — SURGICAL PATHOLOGY

## 2023-09-12 DIAGNOSIS — K5730 Diverticulosis of large intestine without perforation or abscess without bleeding: Secondary | ICD-10-CM | POA: Diagnosis not present

## 2023-09-12 DIAGNOSIS — K8020 Calculus of gallbladder without cholecystitis without obstruction: Secondary | ICD-10-CM | POA: Diagnosis not present

## 2023-09-12 DIAGNOSIS — N201 Calculus of ureter: Secondary | ICD-10-CM | POA: Diagnosis not present

## 2023-09-12 DIAGNOSIS — N132 Hydronephrosis with renal and ureteral calculous obstruction: Secondary | ICD-10-CM | POA: Diagnosis not present

## 2023-09-17 DIAGNOSIS — G4733 Obstructive sleep apnea (adult) (pediatric): Secondary | ICD-10-CM | POA: Diagnosis not present

## 2023-09-17 LAB — CALCULI, WITH PHOTOGRAPH (CLINICAL LAB)
Calcium Oxalate Dihydrate: 40 %
Calcium Oxalate Monohydrate: 40 %
Hydroxyapatite: 20 %
Weight Calculi: 18 mg

## 2023-10-15 DIAGNOSIS — N201 Calculus of ureter: Secondary | ICD-10-CM | POA: Diagnosis not present

## 2023-10-16 DIAGNOSIS — E039 Hypothyroidism, unspecified: Secondary | ICD-10-CM | POA: Diagnosis not present

## 2023-10-16 DIAGNOSIS — N200 Calculus of kidney: Secondary | ICD-10-CM | POA: Diagnosis not present

## 2023-10-16 DIAGNOSIS — Z0000 Encounter for general adult medical examination without abnormal findings: Secondary | ICD-10-CM | POA: Diagnosis not present

## 2023-10-16 DIAGNOSIS — N1831 Chronic kidney disease, stage 3a: Secondary | ICD-10-CM | POA: Diagnosis not present

## 2023-10-16 DIAGNOSIS — Z23 Encounter for immunization: Secondary | ICD-10-CM | POA: Diagnosis not present

## 2023-10-16 DIAGNOSIS — N1330 Unspecified hydronephrosis: Secondary | ICD-10-CM | POA: Diagnosis not present

## 2023-10-16 DIAGNOSIS — E1122 Type 2 diabetes mellitus with diabetic chronic kidney disease: Secondary | ICD-10-CM | POA: Diagnosis not present

## 2023-10-16 DIAGNOSIS — N3000 Acute cystitis without hematuria: Secondary | ICD-10-CM | POA: Diagnosis not present

## 2023-10-16 DIAGNOSIS — G4733 Obstructive sleep apnea (adult) (pediatric): Secondary | ICD-10-CM | POA: Diagnosis not present

## 2023-10-16 DIAGNOSIS — H9190 Unspecified hearing loss, unspecified ear: Secondary | ICD-10-CM | POA: Diagnosis not present

## 2023-10-20 DIAGNOSIS — N898 Other specified noninflammatory disorders of vagina: Secondary | ICD-10-CM | POA: Diagnosis not present

## 2023-10-20 DIAGNOSIS — N3941 Urge incontinence: Secondary | ICD-10-CM | POA: Diagnosis not present

## 2023-10-20 DIAGNOSIS — L309 Dermatitis, unspecified: Secondary | ICD-10-CM | POA: Diagnosis not present

## 2023-10-20 DIAGNOSIS — N958 Other specified menopausal and perimenopausal disorders: Secondary | ICD-10-CM | POA: Diagnosis not present

## 2023-10-21 DIAGNOSIS — K52832 Lymphocytic colitis: Secondary | ICD-10-CM | POA: Diagnosis not present

## 2023-11-14 DIAGNOSIS — J029 Acute pharyngitis, unspecified: Secondary | ICD-10-CM | POA: Diagnosis not present

## 2023-11-14 DIAGNOSIS — R051 Acute cough: Secondary | ICD-10-CM | POA: Diagnosis not present

## 2023-11-14 DIAGNOSIS — J3489 Other specified disorders of nose and nasal sinuses: Secondary | ICD-10-CM | POA: Diagnosis not present

## 2023-11-20 DIAGNOSIS — J069 Acute upper respiratory infection, unspecified: Secondary | ICD-10-CM | POA: Diagnosis not present

## 2023-11-21 DIAGNOSIS — G4733 Obstructive sleep apnea (adult) (pediatric): Secondary | ICD-10-CM | POA: Diagnosis not present

## 2023-12-01 DIAGNOSIS — K429 Umbilical hernia without obstruction or gangrene: Secondary | ICD-10-CM | POA: Diagnosis not present

## 2023-12-01 DIAGNOSIS — K5730 Diverticulosis of large intestine without perforation or abscess without bleeding: Secondary | ICD-10-CM | POA: Diagnosis not present

## 2023-12-01 DIAGNOSIS — K8020 Calculus of gallbladder without cholecystitis without obstruction: Secondary | ICD-10-CM | POA: Diagnosis not present

## 2023-12-01 DIAGNOSIS — N201 Calculus of ureter: Secondary | ICD-10-CM | POA: Diagnosis not present

## 2023-12-15 DIAGNOSIS — N201 Calculus of ureter: Secondary | ICD-10-CM | POA: Diagnosis not present

## 2023-12-15 DIAGNOSIS — N130 Hydronephrosis with ureteropelvic junction obstruction: Secondary | ICD-10-CM | POA: Diagnosis not present

## 2023-12-17 DIAGNOSIS — L309 Dermatitis, unspecified: Secondary | ICD-10-CM | POA: Diagnosis not present

## 2023-12-17 DIAGNOSIS — N958 Other specified menopausal and perimenopausal disorders: Secondary | ICD-10-CM | POA: Diagnosis not present

## 2023-12-26 DIAGNOSIS — H6123 Impacted cerumen, bilateral: Secondary | ICD-10-CM | POA: Diagnosis not present

## 2024-01-02 DIAGNOSIS — H6121 Impacted cerumen, right ear: Secondary | ICD-10-CM | POA: Diagnosis not present

## 2024-01-08 DIAGNOSIS — H61891 Other specified disorders of right external ear: Secondary | ICD-10-CM | POA: Diagnosis not present

## 2024-02-13 DIAGNOSIS — Z833 Family history of diabetes mellitus: Secondary | ICD-10-CM | POA: Diagnosis not present

## 2024-02-13 DIAGNOSIS — Z882 Allergy status to sulfonamides status: Secondary | ICD-10-CM | POA: Diagnosis not present

## 2024-02-13 DIAGNOSIS — Z79890 Hormone replacement therapy (postmenopausal): Secondary | ICD-10-CM | POA: Diagnosis not present

## 2024-02-13 DIAGNOSIS — E039 Hypothyroidism, unspecified: Secondary | ICD-10-CM | POA: Diagnosis not present

## 2024-02-13 DIAGNOSIS — N189 Chronic kidney disease, unspecified: Secondary | ICD-10-CM | POA: Diagnosis not present

## 2024-02-13 DIAGNOSIS — E785 Hyperlipidemia, unspecified: Secondary | ICD-10-CM | POA: Diagnosis not present

## 2024-02-13 DIAGNOSIS — E1122 Type 2 diabetes mellitus with diabetic chronic kidney disease: Secondary | ICD-10-CM | POA: Diagnosis not present

## 2024-02-13 DIAGNOSIS — N3941 Urge incontinence: Secondary | ICD-10-CM | POA: Diagnosis not present

## 2024-02-13 DIAGNOSIS — Z7984 Long term (current) use of oral hypoglycemic drugs: Secondary | ICD-10-CM | POA: Diagnosis not present

## 2024-02-13 DIAGNOSIS — Z87891 Personal history of nicotine dependence: Secondary | ICD-10-CM | POA: Diagnosis not present

## 2024-02-13 DIAGNOSIS — Z008 Encounter for other general examination: Secondary | ICD-10-CM | POA: Diagnosis not present

## 2024-02-13 DIAGNOSIS — I129 Hypertensive chronic kidney disease with stage 1 through stage 4 chronic kidney disease, or unspecified chronic kidney disease: Secondary | ICD-10-CM | POA: Diagnosis not present

## 2024-02-13 DIAGNOSIS — E1151 Type 2 diabetes mellitus with diabetic peripheral angiopathy without gangrene: Secondary | ICD-10-CM | POA: Diagnosis not present

## 2024-03-08 DIAGNOSIS — W19XXXA Unspecified fall, initial encounter: Secondary | ICD-10-CM | POA: Diagnosis not present

## 2024-03-08 DIAGNOSIS — S20211A Contusion of right front wall of thorax, initial encounter: Secondary | ICD-10-CM | POA: Diagnosis not present

## 2024-04-09 ENCOUNTER — Ambulatory Visit: Admitting: Podiatry

## 2024-04-09 ENCOUNTER — Encounter: Payer: Self-pay | Admitting: Podiatry

## 2024-04-09 DIAGNOSIS — E119 Type 2 diabetes mellitus without complications: Secondary | ICD-10-CM | POA: Diagnosis not present

## 2024-04-09 DIAGNOSIS — B351 Tinea unguium: Secondary | ICD-10-CM

## 2024-04-09 DIAGNOSIS — M79674 Pain in right toe(s): Secondary | ICD-10-CM | POA: Diagnosis not present

## 2024-04-09 NOTE — Progress Notes (Signed)
 This patient presents to the office with chief complaint of long thick nails and diabetic feet.  This patient  says there  is  no pain and discomfort in their feet.  This patient says there are long thick painful nails.  These nails are painful walking and wearing shoes.  Patient has no history of infection or drainage from both feet.  Patient is unable to  self treat his own nails . This patient presents  to the office today for treatment of the  long nails and a foot evaluation due to history of  diabetes.  General Appearance  Alert, conversant and in no acute stress.  Vascular  Dorsalis pedis and posterior tibial  pulses are palpable  bilaterally.  Capillary return is within normal limits  bilaterally. Temperature is within normal limits  bilaterally.  Neurologic  Senn-Weinstein monofilament wire test within normal limits  bilaterally. Muscle power within normal limits bilaterally.  Nails Thick disfigured discolored nails with subungual debris  great toenail  right foot. No evidence of bacterial infection or drainage bilaterally.  Orthopedic  No limitations of motion of motion feet .  No crepitus or effusions noted.  No bony pathology or digital deformities noted.  Skin  normotropic skin with no porokeratosis noted bilaterally.  No signs of infections or ulcers noted.     Onychomycosis  Diabetes with no foot complications  IE  Debride nails x 10.  A diabetic foot exam was performed and there is no evidence of any vascular or neurologic pathology.   RTC 3 months.   Ruffin Cotton DPM

## 2024-04-14 ENCOUNTER — Ambulatory Visit: Admitting: Emergency Medicine

## 2024-04-24 ENCOUNTER — Other Ambulatory Visit (HOSPITAL_BASED_OUTPATIENT_CLINIC_OR_DEPARTMENT_OTHER): Payer: Self-pay | Admitting: Internal Medicine

## 2024-04-24 DIAGNOSIS — M85859 Other specified disorders of bone density and structure, unspecified thigh: Secondary | ICD-10-CM

## 2024-04-26 DIAGNOSIS — N1831 Chronic kidney disease, stage 3a: Secondary | ICD-10-CM | POA: Diagnosis not present

## 2024-04-26 DIAGNOSIS — E785 Hyperlipidemia, unspecified: Secondary | ICD-10-CM | POA: Diagnosis not present

## 2024-04-26 DIAGNOSIS — E1169 Type 2 diabetes mellitus with other specified complication: Secondary | ICD-10-CM | POA: Diagnosis not present

## 2024-04-26 DIAGNOSIS — M85852 Other specified disorders of bone density and structure, left thigh: Secondary | ICD-10-CM | POA: Diagnosis not present

## 2024-04-28 ENCOUNTER — Ambulatory Visit
Admission: RE | Admit: 2024-04-28 | Discharge: 2024-04-28 | Disposition: A | Discharge status: Home / Self Care | Source: Ambulatory Visit | Attending: Emergency Medicine | Admitting: Emergency Medicine

## 2024-04-28 DIAGNOSIS — R911 Solitary pulmonary nodule: Secondary | ICD-10-CM

## 2024-05-04 DIAGNOSIS — E119 Type 2 diabetes mellitus without complications: Secondary | ICD-10-CM | POA: Diagnosis not present

## 2024-05-04 DIAGNOSIS — Z9841 Cataract extraction status, right eye: Secondary | ICD-10-CM | POA: Diagnosis not present

## 2024-05-04 DIAGNOSIS — Z9842 Cataract extraction status, left eye: Secondary | ICD-10-CM | POA: Diagnosis not present

## 2024-05-04 DIAGNOSIS — H5211 Myopia, right eye: Secondary | ICD-10-CM | POA: Diagnosis not present

## 2024-05-20 ENCOUNTER — Ambulatory Visit (HOSPITAL_BASED_OUTPATIENT_CLINIC_OR_DEPARTMENT_OTHER)
Admission: RE | Admit: 2024-05-20 | Discharge: 2024-05-20 | Disposition: A | Discharge status: Home / Self Care | Source: Ambulatory Visit | Attending: Internal Medicine | Admitting: Internal Medicine

## 2024-05-20 DIAGNOSIS — M85859 Other specified disorders of bone density and structure, unspecified thigh: Secondary | ICD-10-CM | POA: Insufficient documentation

## 2024-05-20 DIAGNOSIS — Z13820 Encounter for screening for osteoporosis: Secondary | ICD-10-CM | POA: Insufficient documentation

## 2024-05-20 DIAGNOSIS — Z780 Asymptomatic menopausal state: Secondary | ICD-10-CM | POA: Insufficient documentation

## 2024-05-24 DIAGNOSIS — E1121 Type 2 diabetes mellitus with diabetic nephropathy: Secondary | ICD-10-CM | POA: Diagnosis not present

## 2024-05-24 DIAGNOSIS — E1169 Type 2 diabetes mellitus with other specified complication: Secondary | ICD-10-CM | POA: Diagnosis not present

## 2024-05-24 DIAGNOSIS — E785 Hyperlipidemia, unspecified: Secondary | ICD-10-CM | POA: Diagnosis not present

## 2024-05-24 DIAGNOSIS — E039 Hypothyroidism, unspecified: Secondary | ICD-10-CM | POA: Diagnosis not present

## 2024-06-11 ENCOUNTER — Encounter: Payer: Self-pay | Admitting: Emergency Medicine

## 2024-06-11 ENCOUNTER — Ambulatory Visit: Admitting: Emergency Medicine

## 2024-06-11 VITALS — BP 106/66 | HR 63 | Temp 98.3°F | Ht 62.0 in | Wt 131.4 lb

## 2024-06-11 DIAGNOSIS — R911 Solitary pulmonary nodule: Secondary | ICD-10-CM | POA: Diagnosis not present

## 2024-06-11 DIAGNOSIS — Z87891 Personal history of nicotine dependence: Secondary | ICD-10-CM | POA: Diagnosis not present

## 2024-06-11 NOTE — Progress Notes (Signed)
 Subjective:    Patient ID: Renee Stout, female    DOB: 07-13-1941, 83 y.o.   MRN: [id number]  HPI 83 year old former smoker (5 pack years) with a history of hypertension, OSA, type 2 diabetes, hypothyroidism, eosinophilia.  She is referred today for pulmonary nodule that was spuriously found on a CT scan of her abdomen/pelvis on 05/13/2023. No hx CA. Today she reports that the Ct Abd was done to eval diarrhea. She had a CSY w Dr Saintclair. She is active and has no SOB.  She uses a CPAP reliably every night  CT abdomen/pelvis 05/13/2023 reviewed by me, shows a 6 mm right lower lobe pulmonary nodule that was spuriously found, unclear significance.  ROV 06/11/2024 --pleasant 83 year old woman with a minimal tobacco history, hypertension, OSA, diabetes, hypothyroidism and eosinophilia.  I have seen her for a pulmonary nodule that was previously identified 05/13/2023 on a CT scan of her abdomen. She has been doing well.  She does not have a family history of lung cancer.  Today she describes  CT scan of the chest 04/28/24 reviewed by me, shows a round solid noncalcified right lower lobe nodule now measuring 7.8 mm (from 6 mm)   Review of Systems As per HPI  Past Medical History:  Diagnosis Date   Arthritis    Chicken pox    Diabetes mellitus without complication (HCC)    Diverticulitis    Eosinophilia    Essential hypertension, benign    GERD (gastroesophageal reflux disease)    History of kidney stones    Hyperlipidemia    Obstructive sleep apnea (adult) (pediatric)    Osteoporosis, unspecified    Type II or unspecified type diabetes mellitus with renal manifestations, not stated as uncontrolled(250.40)    Unspecified hypothyroidism      Family History  Problem Relation Age of Onset   Coronary artery disease Other        positive for early CAD   Diabetes Brother    Diabetes Sister     No family hx lung CA.   Social History   Socioeconomic History   Marital status: Widowed     Spouse name: Not on file   Number of children: Not on file   Years of education: Not on file   Highest education level: Not on file  Occupational History   Not on file  Tobacco Use   Smoking status: Former    Current packs/day: 0.00    Types: Cigarettes    Start date: 11/25/1978    Quit date: 11/25/1988    Years since quitting: 35.5   Smokeless tobacco: Not on file  Vaping Use   Vaping status: Not on file  Substance and Sexual Activity   Alcohol use: Not Currently    Comment: rare    Drug use: No   Sexual activity: Not on file  Other Topics Concern   Not on file  Social History Narrative   No caffeine   No Recreational drug use    Diet: regular   Exercise : yes - 30 min a day on treadmill 5x week    Occupation : retired Education officer, community status : married ,Husband also our patient   Children: girls 1         Social Drivers of Corporate investment banker Strain: Not on file  Food Insecurity: Not on file  Transportation Needs: Not on file  Physical Activity: Not on file  Stress: Not on file  Social Connections:  Not on file  Intimate Partner Violence: Not on file     Allergies  Allergen Reactions   Sulfa Antibiotics Hives    unknown     Outpatient Medications Prior to Visit  Medication Sig Dispense Refill   budesonide (ENTOCORT EC) 3 MG 24 hr capsule Take 6 mg by mouth daily.     Calcium Carb-Cholecalciferol (CALCIUM 600 + D PO) Take 1 tablet by mouth 2 (two) times daily.     clobetasol ointment (TEMOVATE) 0.05 % Apply 1 Application topically 2 (two) times a week.     estradiol (ESTRACE) 0.1 MG/GM vaginal cream Place 1 Applicatorful vaginally 3 (three) times a week.     levothyroxine  (SYNTHROID ) 75 MCG tablet Take 75 mcg by mouth daily before breakfast.     lisinopril (ZESTRIL) 5 MG tablet Take 5 mg by mouth at bedtime.     Multiple Vitamins-Minerals (MULTIVITAL) tablet Take 1 tablet by mouth daily. 50+     simvastatin (ZOCOR) 20 MG tablet Take 20 mg by  mouth at bedtime.     metFORMIN (GLUCOPHAGE) 500 MG tablet Take 500 mg by mouth 2 (two) times daily.     phenazopyridine  (PYRIDIUM ) 200 MG tablet Take 1 tablet (200 mg total) by mouth 3 (three) times daily as needed for pain. 10 tablet 0   traMADol  (ULTRAM ) 50 MG tablet Take 1-2 tablets (50-100 mg total) by mouth every 6 (six) hours as needed for moderate pain. 15 tablet 0   No facility-administered medications prior to visit.         Objective:   Physical Exam  Vitals:   06/11/24 1104  BP: 106/66  Pulse: 63  Temp: 98.3 F (36.8 C)  TempSrc: Temporal  SpO2: 94%  Weight: 131 lb 6.4 oz (59.6 kg)  Height: 5' 2 (1.575 m)   Gen: Pleasant, well-nourished, in no distress,  normal affect  ENT: No lesions,  mouth clear,  oropharynx clear, no postnasal drip  Neck: No JVD, no stridor  Lungs: No use of accessory muscles, no crackles or wheezing on normal respiration, no wheeze on forced expiration  Cardiovascular: RRR, heart sounds normal, no murmur or gallops, no peripheral edema  Musculoskeletal: No deformities, no cyanosis or clubbing  Neuro: alert, awake, non focal  Skin: Warm, no lesions or rash      Assessment & Plan:   Pulmonary nodule Slight increase in size on her CT scan of the chest from 04/2024 compared with her previous abdominal CTs.  Currently 7.8 mm.  Will plan to continue to follow, next CT scan in December.  Considered PET scan but unclear that the nodule meets the size threshold for PET scan to be useful.  If it changes in size and we will consider PET, consider biopsy or even referral for primary resection.   Renee Chris, MD, PhD 06/11/2024, 11:22 AM [facility] [phone] or if no answer before 7:00PM call [phone] For any issues after 7:00PM please call eLink [phone]

## 2024-06-11 NOTE — Assessment & Plan Note (Signed)
 Slight increase in size on her CT scan of the chest from 04/2024 compared with her previous abdominal CTs.  Currently 7.8 mm.  Will plan to continue to follow, next CT scan in December.  Considered PET scan but unclear that the nodule meets the size threshold for PET scan to be useful.  If it changes in size and we will consider PET, consider biopsy or even referral for primary resection.

## 2024-06-11 NOTE — Patient Instructions (Signed)
 We reviewed your CT scan of the chest today. We will plan to repeat your CT scan of the chest in December 2025 to follow a small pulmonary nodule. Please follow Dr. Shelah in December after that CT so we can review the results together.  Call sooner if you have any problems.

## 2024-06-24 DIAGNOSIS — E1169 Type 2 diabetes mellitus with other specified complication: Secondary | ICD-10-CM | POA: Diagnosis not present

## 2024-06-24 DIAGNOSIS — E785 Hyperlipidemia, unspecified: Secondary | ICD-10-CM | POA: Diagnosis not present

## 2024-06-24 DIAGNOSIS — E039 Hypothyroidism, unspecified: Secondary | ICD-10-CM | POA: Diagnosis not present

## 2024-06-24 DIAGNOSIS — E1121 Type 2 diabetes mellitus with diabetic nephropathy: Secondary | ICD-10-CM | POA: Diagnosis not present

## 2024-07-25 DIAGNOSIS — E039 Hypothyroidism, unspecified: Secondary | ICD-10-CM | POA: Diagnosis not present

## 2024-07-25 DIAGNOSIS — E785 Hyperlipidemia, unspecified: Secondary | ICD-10-CM | POA: Diagnosis not present

## 2024-07-25 DIAGNOSIS — E1169 Type 2 diabetes mellitus with other specified complication: Secondary | ICD-10-CM | POA: Diagnosis not present

## 2024-07-25 DIAGNOSIS — E1121 Type 2 diabetes mellitus with diabetic nephropathy: Secondary | ICD-10-CM | POA: Diagnosis not present

## 2024-08-04 DIAGNOSIS — G4733 Obstructive sleep apnea (adult) (pediatric): Secondary | ICD-10-CM | POA: Diagnosis not present

## 2024-08-16 ENCOUNTER — Ambulatory Visit: Admitting: Podiatry

## 2024-08-24 DIAGNOSIS — E1169 Type 2 diabetes mellitus with other specified complication: Secondary | ICD-10-CM | POA: Diagnosis not present

## 2024-08-24 DIAGNOSIS — E1121 Type 2 diabetes mellitus with diabetic nephropathy: Secondary | ICD-10-CM | POA: Diagnosis not present

## 2024-08-24 DIAGNOSIS — E785 Hyperlipidemia, unspecified: Secondary | ICD-10-CM | POA: Diagnosis not present

## 2024-08-24 DIAGNOSIS — E039 Hypothyroidism, unspecified: Secondary | ICD-10-CM | POA: Diagnosis not present

## 2024-09-22 DIAGNOSIS — G4733 Obstructive sleep apnea (adult) (pediatric): Secondary | ICD-10-CM | POA: Diagnosis not present

## 2024-09-24 DIAGNOSIS — E1169 Type 2 diabetes mellitus with other specified complication: Secondary | ICD-10-CM | POA: Diagnosis not present

## 2024-09-24 DIAGNOSIS — E1121 Type 2 diabetes mellitus with diabetic nephropathy: Secondary | ICD-10-CM | POA: Diagnosis not present

## 2024-09-24 DIAGNOSIS — E785 Hyperlipidemia, unspecified: Secondary | ICD-10-CM | POA: Diagnosis not present

## 2024-09-24 DIAGNOSIS — E039 Hypothyroidism, unspecified: Secondary | ICD-10-CM | POA: Diagnosis not present

## 2024-09-28 DIAGNOSIS — W19XXXA Unspecified fall, initial encounter: Secondary | ICD-10-CM | POA: Diagnosis not present

## 2024-09-28 DIAGNOSIS — S2242XA Multiple fractures of ribs, left side, initial encounter for closed fracture: Secondary | ICD-10-CM | POA: Diagnosis not present

## 2024-09-28 DIAGNOSIS — M25561 Pain in right knee: Secondary | ICD-10-CM | POA: Diagnosis not present

## 2024-09-28 DIAGNOSIS — S299XXA Unspecified injury of thorax, initial encounter: Secondary | ICD-10-CM | POA: Diagnosis not present

## 2024-10-11 DIAGNOSIS — N8110 Cystocele, unspecified: Secondary | ICD-10-CM | POA: Diagnosis not present

## 2024-10-11 DIAGNOSIS — K5289 Other specified noninfective gastroenteritis and colitis: Secondary | ICD-10-CM | POA: Diagnosis not present

## 2024-10-11 DIAGNOSIS — R21 Rash and other nonspecific skin eruption: Secondary | ICD-10-CM | POA: Diagnosis not present

## 2024-10-18 DIAGNOSIS — L309 Dermatitis, unspecified: Secondary | ICD-10-CM | POA: Diagnosis not present

## 2024-10-18 DIAGNOSIS — N814 Uterovaginal prolapse, unspecified: Secondary | ICD-10-CM | POA: Diagnosis not present

## 2024-10-18 DIAGNOSIS — N958 Other specified menopausal and perimenopausal disorders: Secondary | ICD-10-CM | POA: Diagnosis not present

## 2024-10-18 DIAGNOSIS — N3941 Urge incontinence: Secondary | ICD-10-CM | POA: Diagnosis not present

## 2024-10-24 DIAGNOSIS — E785 Hyperlipidemia, unspecified: Secondary | ICD-10-CM | POA: Diagnosis not present

## 2024-10-24 DIAGNOSIS — E1121 Type 2 diabetes mellitus with diabetic nephropathy: Secondary | ICD-10-CM | POA: Diagnosis not present

## 2024-10-24 DIAGNOSIS — E039 Hypothyroidism, unspecified: Secondary | ICD-10-CM | POA: Diagnosis not present

## 2024-10-24 DIAGNOSIS — E1169 Type 2 diabetes mellitus with other specified complication: Secondary | ICD-10-CM | POA: Diagnosis not present

## 2024-11-01 ENCOUNTER — Ambulatory Visit
Admission: RE | Admit: 2024-11-01 | Discharge: 2024-11-01 | Disposition: A | Source: Ambulatory Visit | Attending: Emergency Medicine | Admitting: Emergency Medicine

## 2024-11-01 DIAGNOSIS — R911 Solitary pulmonary nodule: Secondary | ICD-10-CM

## 2024-11-03 DIAGNOSIS — E785 Hyperlipidemia, unspecified: Secondary | ICD-10-CM | POA: Diagnosis not present

## 2024-11-03 DIAGNOSIS — E039 Hypothyroidism, unspecified: Secondary | ICD-10-CM | POA: Diagnosis not present

## 2024-11-03 DIAGNOSIS — E1121 Type 2 diabetes mellitus with diabetic nephropathy: Secondary | ICD-10-CM | POA: Diagnosis not present

## 2024-11-04 DIAGNOSIS — Z4689 Encounter for fitting and adjustment of other specified devices: Secondary | ICD-10-CM | POA: Diagnosis not present

## 2024-11-10 ENCOUNTER — Encounter: Payer: Self-pay | Admitting: Emergency Medicine

## 2024-11-10 ENCOUNTER — Ambulatory Visit: Admitting: Emergency Medicine

## 2024-11-10 VITALS — BP 100/64 | HR 79 | Temp 97.3°F | Ht 62.0 in | Wt 136.0 lb

## 2024-11-10 DIAGNOSIS — R911 Solitary pulmonary nodule: Secondary | ICD-10-CM

## 2024-11-10 NOTE — Patient Instructions (Signed)
 We reviewed your CT scan of the chest today.  This is stable compared with priors.  Good news. You should not need any further CT scans to follow small right lower lobe pulmonary nodule unless you experience a clinical change in symptoms. Please follow Dr. Shelah as needed for any new respiratory issues so we can discuss.

## 2024-11-10 NOTE — Assessment & Plan Note (Signed)
 In retrospect her right lower lobe pulmonary nodule appears stable, measured at 6 mm 10/2024.  No change dating back to June/2024.  She should not need any further surveillance unless she experiences a clinical change.  I reassured her about this.  She will contact me if any respiratory symptoms evolve.

## 2024-11-10 NOTE — Progress Notes (Signed)
° °  Subjective:    Patient ID: Renee Stout, female    DOB: 20-Dec-1940, 83 y.o.   MRN: [id number]  HPI  ROV 11/10/2024 --follow-up visit for 83 year old woman with history of minimal tobacco use and pulmonary nodule that we have been following on serial imaging.  She has a history of hypertension, OSA, hypothyroidism and eosinophilia.  No family history of lung cancer.  Her CT chest from June/2025 showed slight enlargement of a right lower lobe noncalcified rounded nodule to 7.8 mm.  We decided to continue to follow with serial imaging and her CT chest was done 12/8 as below.  She is doing well. She is not having any resp sx.   CT scan of the chest 11/01/2024 reviewed by me, shows smoothly marginated basilar right lower lobe nodule measured at 6 mm and deemed to be unchanged going back to 04/2023.   Review of Systems As per HPI      Objective:   Physical Exam  Vitals:   11/10/24 1057  BP: 100/64  Pulse: 79  Temp: (!) 97.3 F (36.3 C)  TempSrc: Temporal  SpO2: 98%  Weight: 136 lb (61.7 kg)  Height: 5' 2 (1.575 m)   Gen: Pleasant, well-nourished, in no distress,  normal affect  ENT: No lesions,  mouth clear,  oropharynx clear, no postnasal drip  Neck: No JVD, no stridor  Lungs: No use of accessory muscles, no crackles or wheezing on normal respiration, no wheeze on forced expiration  Cardiovascular: RRR, heart sounds normal, no murmur or gallops, no peripheral edema  Musculoskeletal: No deformities, no cyanosis or clubbing  Neuro: alert, awake, non focal  Skin: Warm, no lesions or rash      Assessment & Plan:   Pulmonary nodule In retrospect her right lower lobe pulmonary nodule appears stable, measured at 6 mm 10/2024.  No change dating back to June/2024.  She should not need any further surveillance unless she experiences a clinical change.  I reassured her about this.  She will contact me if any respiratory symptoms evolve.    Lamar Chris, MD,  PhD 11/10/2024, 11:13 AM [facility] [phone] or if no answer before 7:00PM call [phone] For any issues after 7:00PM please call eLink [phone]
